# Patient Record
Sex: Female | Born: 1935 | Race: White | Hispanic: No | Smoking: Former smoker
Health system: Southern US, Community
[De-identification: ages and names within clinical notes are randomized; demographics above are authoritative.]

## PROBLEM LIST (undated history)

## (undated) DIAGNOSIS — F0390 Unspecified dementia without behavioral disturbance: Secondary | ICD-10-CM

## (undated) DIAGNOSIS — J189 Pneumonia, unspecified organism: Secondary | ICD-10-CM

## (undated) DIAGNOSIS — J449 Chronic obstructive pulmonary disease, unspecified: Secondary | ICD-10-CM

## (undated) DIAGNOSIS — I509 Heart failure, unspecified: Secondary | ICD-10-CM

## (undated) DIAGNOSIS — E039 Hypothyroidism, unspecified: Secondary | ICD-10-CM

## (undated) DIAGNOSIS — N189 Chronic kidney disease, unspecified: Secondary | ICD-10-CM

## (undated) DIAGNOSIS — C801 Malignant (primary) neoplasm, unspecified: Secondary | ICD-10-CM

## (undated) DIAGNOSIS — I4891 Unspecified atrial fibrillation: Secondary | ICD-10-CM

## (undated) HISTORY — PX: CATARACT EXTRACTION: SUR2

## (undated) HISTORY — PX: COLON SURGERY: SHX602

---

## 2015-12-20 DIAGNOSIS — J189 Pneumonia, unspecified organism: Secondary | ICD-10-CM

## 2015-12-20 HISTORY — DX: Pneumonia, unspecified organism: J18.9

## 2015-12-29 ENCOUNTER — Encounter (HOSPITAL_COMMUNITY): Payer: Self-pay | Admitting: Emergency Medicine

## 2015-12-29 ENCOUNTER — Emergency Department (HOSPITAL_COMMUNITY): Payer: Medicare Other

## 2015-12-29 ENCOUNTER — Inpatient Hospital Stay (HOSPITAL_COMMUNITY)
Admission: EM | Admit: 2015-12-29 | Discharge: 2016-01-20 | DRG: 871 | Disposition: E | Payer: Medicare Other | Attending: Internal Medicine | Admitting: Internal Medicine

## 2015-12-29 DIAGNOSIS — J189 Pneumonia, unspecified organism: Secondary | ICD-10-CM | POA: Diagnosis present

## 2015-12-29 DIAGNOSIS — Z8679 Personal history of other diseases of the circulatory system: Secondary | ICD-10-CM

## 2015-12-29 DIAGNOSIS — E039 Hypothyroidism, unspecified: Secondary | ICD-10-CM | POA: Diagnosis present

## 2015-12-29 DIAGNOSIS — I083 Combined rheumatic disorders of mitral, aortic and tricuspid valves: Secondary | ICD-10-CM | POA: Diagnosis present

## 2015-12-29 DIAGNOSIS — J9600 Acute respiratory failure, unspecified whether with hypoxia or hypercapnia: Secondary | ICD-10-CM

## 2015-12-29 DIAGNOSIS — F0390 Unspecified dementia without behavioral disturbance: Secondary | ICD-10-CM | POA: Diagnosis present

## 2015-12-29 DIAGNOSIS — Z885 Allergy status to narcotic agent status: Secondary | ICD-10-CM

## 2015-12-29 DIAGNOSIS — A419 Sepsis, unspecified organism: Principal | ICD-10-CM

## 2015-12-29 DIAGNOSIS — R680 Hypothermia, not associated with low environmental temperature: Secondary | ICD-10-CM | POA: Diagnosis present

## 2015-12-29 DIAGNOSIS — I482 Chronic atrial fibrillation, unspecified: Secondary | ICD-10-CM | POA: Diagnosis present

## 2015-12-29 DIAGNOSIS — J42 Unspecified chronic bronchitis: Secondary | ICD-10-CM | POA: Diagnosis not present

## 2015-12-29 DIAGNOSIS — R9431 Abnormal electrocardiogram [ECG] [EKG]: Secondary | ICD-10-CM | POA: Diagnosis present

## 2015-12-29 DIAGNOSIS — Y95 Nosocomial condition: Secondary | ICD-10-CM | POA: Diagnosis present

## 2015-12-29 DIAGNOSIS — E875 Hyperkalemia: Secondary | ICD-10-CM | POA: Diagnosis not present

## 2015-12-29 DIAGNOSIS — M25552 Pain in left hip: Secondary | ICD-10-CM | POA: Diagnosis present

## 2015-12-29 DIAGNOSIS — Z79899 Other long term (current) drug therapy: Secondary | ICD-10-CM

## 2015-12-29 DIAGNOSIS — R0602 Shortness of breath: Secondary | ICD-10-CM

## 2015-12-29 DIAGNOSIS — E872 Acidosis: Secondary | ICD-10-CM

## 2015-12-29 DIAGNOSIS — E871 Hypo-osmolality and hyponatremia: Secondary | ICD-10-CM | POA: Diagnosis present

## 2015-12-29 DIAGNOSIS — N179 Acute kidney failure, unspecified: Secondary | ICD-10-CM | POA: Diagnosis present

## 2015-12-29 DIAGNOSIS — E860 Dehydration: Secondary | ICD-10-CM | POA: Diagnosis present

## 2015-12-29 DIAGNOSIS — Z66 Do not resuscitate: Secondary | ICD-10-CM | POA: Diagnosis present

## 2015-12-29 DIAGNOSIS — R52 Pain, unspecified: Secondary | ICD-10-CM

## 2015-12-29 DIAGNOSIS — E43 Unspecified severe protein-calorie malnutrition: Secondary | ICD-10-CM | POA: Diagnosis present

## 2015-12-29 DIAGNOSIS — N182 Chronic kidney disease, stage 2 (mild): Secondary | ICD-10-CM | POA: Diagnosis present

## 2015-12-29 DIAGNOSIS — Z7189 Other specified counseling: Secondary | ICD-10-CM | POA: Insufficient documentation

## 2015-12-29 DIAGNOSIS — R740 Nonspecific elevation of levels of transaminase and lactic acid dehydrogenase [LDH]: Secondary | ICD-10-CM | POA: Diagnosis present

## 2015-12-29 DIAGNOSIS — J449 Chronic obstructive pulmonary disease, unspecified: Secondary | ICD-10-CM | POA: Diagnosis present

## 2015-12-29 DIAGNOSIS — J440 Chronic obstructive pulmonary disease with acute lower respiratory infection: Secondary | ICD-10-CM | POA: Diagnosis present

## 2015-12-29 DIAGNOSIS — Z6820 Body mass index (BMI) 20.0-20.9, adult: Secondary | ICD-10-CM

## 2015-12-29 DIAGNOSIS — R7989 Other specified abnormal findings of blood chemistry: Secondary | ICD-10-CM | POA: Diagnosis present

## 2015-12-29 DIAGNOSIS — Z825 Family history of asthma and other chronic lower respiratory diseases: Secondary | ICD-10-CM

## 2015-12-29 DIAGNOSIS — I5022 Chronic systolic (congestive) heart failure: Secondary | ICD-10-CM | POA: Diagnosis present

## 2015-12-29 DIAGNOSIS — Z87891 Personal history of nicotine dependence: Secondary | ICD-10-CM

## 2015-12-29 DIAGNOSIS — J441 Chronic obstructive pulmonary disease with (acute) exacerbation: Secondary | ICD-10-CM | POA: Diagnosis present

## 2015-12-29 DIAGNOSIS — J219 Acute bronchiolitis, unspecified: Secondary | ICD-10-CM

## 2015-12-29 DIAGNOSIS — J9601 Acute respiratory failure with hypoxia: Secondary | ICD-10-CM | POA: Diagnosis present

## 2015-12-29 DIAGNOSIS — Z888 Allergy status to other drugs, medicaments and biological substances status: Secondary | ICD-10-CM

## 2015-12-29 DIAGNOSIS — R54 Age-related physical debility: Secondary | ICD-10-CM | POA: Diagnosis present

## 2015-12-29 DIAGNOSIS — Z515 Encounter for palliative care: Secondary | ICD-10-CM | POA: Insufficient documentation

## 2015-12-29 HISTORY — DX: Unspecified atrial fibrillation: I48.91

## 2015-12-29 HISTORY — DX: Chronic kidney disease, unspecified: N18.9

## 2015-12-29 HISTORY — DX: Unspecified dementia, unspecified severity, without behavioral disturbance, psychotic disturbance, mood disturbance, and anxiety: F03.90

## 2015-12-29 HISTORY — DX: Hypothyroidism, unspecified: E03.9

## 2015-12-29 HISTORY — DX: Chronic obstructive pulmonary disease, unspecified: J44.9

## 2015-12-29 HISTORY — DX: Heart failure, unspecified: I50.9

## 2015-12-29 HISTORY — DX: Malignant (primary) neoplasm, unspecified: C80.1

## 2015-12-29 HISTORY — DX: Pneumonia, unspecified organism: J18.9

## 2015-12-29 LAB — BASIC METABOLIC PANEL
Anion gap: 11 (ref 5–15)
Anion gap: 9 (ref 5–15)
BUN: 49 mg/dL — ABNORMAL HIGH (ref 6–20)
BUN: 47 mg/dL — ABNORMAL HIGH (ref 6–20)
CO2: 21 mmol/L — ABNORMAL LOW (ref 22–32)
CO2: 22 mmol/L (ref 22–32)
Calcium: 8.9 mg/dL (ref 8.9–10.3)
Calcium: 8.4 mg/dL — ABNORMAL LOW (ref 8.9–10.3)
Chloride: 96 mmol/L — ABNORMAL LOW (ref 101–111)
Chloride: 99 mmol/L — ABNORMAL LOW (ref 101–111)
Creatinine, Ser: 1.18 mg/dL — ABNORMAL HIGH (ref 0.44–1.00)
Creatinine, Ser: 1.14 mg/dL — ABNORMAL HIGH (ref 0.44–1.00)
GFR calc Af Amer: 46 mL/min — ABNORMAL LOW (ref >60)
GFR calc Af Amer: 48 mL/min — ABNORMAL LOW (ref >60)
GFR calc non Af Amer: 39 mL/min — ABNORMAL LOW (ref >60)
GFR calc non Af Amer: 41 mL/min — ABNORMAL LOW (ref >60)
Glucose, Bld: 89 mg/dL (ref 65–99)
Glucose, Bld: 103 mg/dL — ABNORMAL HIGH (ref 65–99)
Potassium: 5.3 mmol/L — ABNORMAL HIGH (ref 3.5–5.1)
Potassium: 5.1 mmol/L (ref 3.5–5.1)
Sodium: 128 mmol/L — ABNORMAL LOW (ref 135–145)
Sodium: 130 mmol/L — ABNORMAL LOW (ref 135–145)

## 2015-12-29 LAB — HEPATIC FUNCTION PANEL
ALT: 128 U/L — ABNORMAL HIGH (ref 14–54)
AST: 47 U/L — ABNORMAL HIGH (ref 15–41)
Albumin: 3.5 g/dL (ref 3.5–5.0)
Alkaline Phosphatase: 75 U/L (ref 38–126)
Bilirubin, Direct: 0.3 mg/dL (ref 0.1–0.5)
Indirect Bilirubin: 0.6 mg/dL (ref 0.3–0.9)
Total Bilirubin: 0.9 mg/dL (ref 0.3–1.2)
Total Protein: 6.6 g/dL (ref 6.5–8.1)

## 2015-12-29 LAB — DIFFERENTIAL
Basophils Absolute: 0 10*3/uL (ref 0.0–0.1)
Basophils Relative: 0 %
Eosinophils Absolute: 0 10*3/uL (ref 0.0–0.7)
Eosinophils Relative: 0 %
Lymphocytes Relative: 7 %
Lymphs Abs: 0.5 10*3/uL — ABNORMAL LOW (ref 0.7–4.0)
Monocytes Absolute: 0.4 10*3/uL (ref 0.1–1.0)
Monocytes Relative: 6 %
Neutro Abs: 6.5 10*3/uL (ref 1.7–7.7)
Neutrophils Relative %: 87 %

## 2015-12-29 LAB — CBC
HCT: 38.6 % (ref 36.0–46.0)
Hemoglobin: 12.3 g/dL (ref 12.0–15.0)
MCH: 28.8 pg (ref 26.0–34.0)
MCHC: 31.9 g/dL (ref 30.0–36.0)
MCV: 90.4 fL (ref 78.0–100.0)
Platelets: 155 10*3/uL (ref 150–400)
RBC: 4.27 MIL/uL (ref 3.87–5.11)
RDW: 18.9 % — ABNORMAL HIGH (ref 11.5–15.5)
WBC: 7.4 10*3/uL (ref 4.0–10.5)

## 2015-12-29 LAB — INFLUENZA PANEL BY PCR (TYPE A & B)
H1N1 flu by pcr: NOT DETECTED
Influenza A By PCR: NEGATIVE
Influenza B By PCR: NEGATIVE

## 2015-12-29 LAB — URINE MICROSCOPIC-ADD ON: RBC / HPF: NONE SEEN RBC/hpf (ref 0–5)

## 2015-12-29 LAB — URINALYSIS, ROUTINE W REFLEX MICROSCOPIC
Bilirubin Urine: NEGATIVE
Glucose, UA: NEGATIVE mg/dL
Hgb urine dipstick: NEGATIVE
Ketones, ur: NEGATIVE mg/dL
Leukocytes, UA: NEGATIVE
Nitrite: NEGATIVE
Protein, ur: 100 mg/dL — AB
Specific Gravity, Urine: 1.025 (ref 1.005–1.030)
pH: 5.5 (ref 5.0–8.0)

## 2015-12-29 LAB — PROCALCITONIN: Procalcitonin: <0.1 ng/mL

## 2015-12-29 LAB — I-STAT CG4 LACTIC ACID, ED
Lactic Acid, Venous: 2.02 mmol/L (ref 0.5–2.0)
Lactic Acid, Venous: 1.07 mmol/L (ref 0.5–2.0)

## 2015-12-29 LAB — STREP PNEUMONIAE URINARY ANTIGEN: Strep Pneumo Urinary Antigen: NEGATIVE

## 2015-12-29 LAB — I-STAT TROPONIN, ED: Troponin i, poc: 0.05 ng/mL (ref 0.00–0.08)

## 2015-12-29 MED ORDER — VANCOMYCIN HCL IN DEXTROSE 1-5 GM/200ML-% IV SOLN
1000.0000 mg | Freq: Once | INTRAVENOUS | Status: AC
  Administered 2015-12-29: 1000 mg via INTRAVENOUS
  Filled 2015-12-29: qty 200

## 2015-12-29 MED ORDER — ALBUTEROL SULFATE (2.5 MG/3ML) 0.083% IN NEBU
5.0000 mg | INHALATION_SOLUTION | Freq: Once | RESPIRATORY_TRACT | Status: AC
  Administered 2015-12-29: 5 mg via RESPIRATORY_TRACT
  Filled 2015-12-29: qty 6

## 2015-12-29 MED ORDER — SODIUM CHLORIDE 0.9 % IV BOLUS (SEPSIS)
1000.0000 mL | INTRAVENOUS | Status: AC
  Administered 2015-12-29 (×2): 1000 mL via INTRAVENOUS

## 2015-12-29 MED ORDER — PIPERACILLIN-TAZOBACTAM 3.375 G IVPB 30 MIN
3.3750 g | Freq: Once | INTRAVENOUS | Status: AC
  Administered 2015-12-29: 3.375 g via INTRAVENOUS
  Filled 2015-12-29: qty 50

## 2015-12-29 MED ORDER — LEVOFLOXACIN IN D5W 750 MG/150ML IV SOLN: 750.0000 mg | INTRAVENOUS | Status: DC

## 2015-12-29 MED ORDER — SODIUM CHLORIDE 0.9 % IV SOLN
INTRAVENOUS | Status: DC
  Administered 2015-12-29 – 2015-12-31 (×4): via INTRAVENOUS

## 2015-12-29 MED ORDER — LEVOFLOXACIN IN D5W 750 MG/150ML IV SOLN
750.0000 mg | Freq: Once | INTRAVENOUS | Status: AC
  Administered 2015-12-29: 750 mg via INTRAVENOUS
  Filled 2015-12-29: qty 150

## 2015-12-29 MED ORDER — LEVOFLOXACIN IN D5W 500 MG/100ML IV SOLN: 500.0000 mg | INTRAVENOUS | Status: DC

## 2015-12-29 MED ORDER — LEVALBUTEROL HCL 0.63 MG/3ML IN NEBU
0.6300 mg | INHALATION_SOLUTION | Freq: Four times a day (QID) | RESPIRATORY_TRACT | Status: DC
  Administered 2015-12-29 (×2): 0.63 mg via RESPIRATORY_TRACT
  Filled 2015-12-29 (×3): qty 3

## 2015-12-29 MED ORDER — SODIUM CHLORIDE 0.9 % IV BOLUS (SEPSIS)
500.0000 mL | Freq: Once | INTRAVENOUS | Status: AC
  Administered 2015-12-29: 500 mL via INTRAVENOUS

## 2015-12-29 MED ORDER — ALPRAZOLAM 0.25 MG PO TABS
0.2500 mg | ORAL_TABLET | Freq: Every evening | ORAL | Status: DC | PRN
  Administered 2015-12-30: 0.25 mg via ORAL
  Filled 2015-12-29: qty 1

## 2015-12-29 MED ORDER — PIPERACILLIN-TAZOBACTAM 3.375 G IVPB: 3.3750 g | Freq: Three times a day (TID) | INTRAVENOUS | Status: DC

## 2015-12-29 MED ORDER — ENOXAPARIN SODIUM 30 MG/0.3ML ~~LOC~~ SOLN
30.0000 mg | SUBCUTANEOUS | Status: DC
  Filled 2015-12-29: qty 0.3

## 2015-12-29 MED ORDER — ENOXAPARIN SODIUM 30 MG/0.3ML ~~LOC~~ SOLN
30.0000 mg | Freq: Every day | SUBCUTANEOUS | Status: DC
  Administered 2015-12-31: 30 mg via SUBCUTANEOUS
  Filled 2015-12-29 (×2): qty 0.3

## 2015-12-29 MED ORDER — CYCLOSPORINE 0.05 % OP EMUL
1.0000 [drp] | Freq: Two times a day (BID) | OPHTHALMIC | Status: DC
  Administered 2015-12-29 – 2015-12-31 (×3): 1 [drp] via OPHTHALMIC
  Filled 2015-12-29 (×6): qty 1

## 2015-12-29 MED ORDER — LORAZEPAM 2 MG/ML IJ SOLN
0.5000 mg | Freq: Once | INTRAMUSCULAR | Status: AC
  Administered 2015-12-29: 0.5 mg via INTRAVENOUS
  Filled 2015-12-29: qty 1

## 2015-12-29 MED ORDER — VANCOMYCIN HCL 500 MG IV SOLR: 500.0000 mg | INTRAVENOUS | Status: DC

## 2015-12-29 MED ORDER — LEVOTHYROXINE SODIUM 25 MCG PO TABS: 25.0000 ug | ORAL_TABLET | Freq: Every day | ORAL | Status: DC

## 2015-12-29 MED ORDER — LORAZEPAM 2 MG/ML IJ SOLN
0.5000 mg | Freq: Once | INTRAMUSCULAR | Status: DC
  Filled 2015-12-29: qty 1

## 2015-12-29 NOTE — ED Notes (Addendum)
Pt sent from PCP for low HR and low 02 sat. Pt reports feeling sob and weak. Pt is alert and ox4. Pt appears lethargic and falling asleep during triage. Per daughter- pt has becoming more lethargic, decreased appetite.

## 2015-12-29 NOTE — Progress Notes (Signed)
Pharmacy Code Sepsis Protocol  Time of code sepsis page: 1310 Time of antibiotic delivery: 1314  Were antibiotics ordered at the time of the code sepsis page? Yes Was it required to contact the physician? [x]  Physician not contacted []  Physician contacted to order antibiotics for code sepsis []  Physician contacted to recommend changing antibiotics  Pharmacy consulted for: Zosyn and vancomycin  Anti-infectives    Start     Dose/Rate Route Frequency Ordered Stop   12/30/15 1400  vancomycin (VANCOCIN) 500 mg in sodium chloride 0.9 % 100 mL IVPB     500 mg 100 mL/hr over 60 Minutes Intravenous Every 24 hours 12/31/2015 1309     01/02/2016 2000  piperacillin-tazobactam (ZOSYN) IVPB 3.375 g     3.375 g 12.5 mL/hr over 240 Minutes Intravenous Every 8 hours 01/09/2016 1309     01/05/2016 1315  piperacillin-tazobactam (ZOSYN) IVPB 3.375 g     3.375 g 100 mL/hr over 30 Minutes Intravenous  Once 12/31/2015 1303     12/31/2015 1315  vancomycin (VANCOCIN) IVPB 1000 mg/200 mL premix     1,000 mg 200 mL/hr over 60 Minutes Intravenous  Once 01/15/2016 1303        @infusions @   Nurse education provided: [x]  Minutes left to administer antibiotics to achieve 1 hour goal [x]  Correct order of antibiotic administration [x]  Antibiotic Y-site compatibilities    Elenor Quinones, PharmD, BCPS Clinical Pharmacist Pager [phone] 01/14/2016 1:15 PM

## 2015-12-29 NOTE — ED Notes (Signed)
Pt O2 sats at 83% on 2L, increased to 4L and only improved to 85%, increased to 6L and improved to 86%. MD Belfi made aware and advised to place pt on NRB. Pt O2 sats at 91%.

## 2015-12-29 NOTE — ED Provider Notes (Signed)
CSN: [id number]     Arrival date & time 01/12/2016  1210 History   First MD Initiated Contact with Patient 01/18/2016 1230     Chief Complaint  Patient presents with  . Shortness of Breath     (Consider location/radiation/quality/duration/timing/severity/associated sxs/prior Treatment) HPI Comments: Patient with a history of COPD and CHF presents with a three-day history of worsening cough. She was seen in urgent care 2 days ago. She was noted that time to have a clear chest x-ray. She was however placed on a Z-Pak. She was given a breathing treatment there. Since that time she's continued to decline. Her daughter states that she's not eating or drinking. She's been very fatigued and has low energy. She's had some nausea but no vomiting. She was seen by her PCP today who sent her to the emergency department. She was noted to have hypoxia at the PCPs office.  Patient is a 82 y.o. female presenting with shortness of breath.  Shortness of Breath Associated symptoms: cough   Associated symptoms: no abdominal pain, no chest pain, no diaphoresis, no fever, no headaches, no rash and no vomiting     Past Medical History  Diagnosis Date  . CHF (congestive heart failure) ([place])   . COPD (chronic obstructive pulmonary disease) [facility])    Past Surgical History  Procedure Laterality Date  . Colon surgery     No family history on file. Social History  Substance Use Topics  . Smoking status: Never Smoker   . Smokeless tobacco: None  . Alcohol Use: No   OB History    No data available     Review of Systems  Constitutional: Positive for activity change, appetite change and fatigue. Negative for fever, chills and diaphoresis.  HENT: Negative for congestion, rhinorrhea and sneezing.   Eyes: Negative.   Respiratory: Positive for cough and shortness of breath. Negative for chest tightness.   Cardiovascular: Positive for leg swelling. Negative for chest pain.  Gastrointestinal: Positive for nausea.  Negative for vomiting, abdominal pain, diarrhea and blood in stool.  Genitourinary: Negative for frequency, hematuria, flank pain and difficulty urinating.  Musculoskeletal: Negative for back pain and arthralgias.  Skin: Negative for rash.  Neurological: Negative for dizziness, speech difficulty, weakness, numbness and headaches.      Allergies  Codeine and Paxil  Home Medications   Prior to Admission medications   Medication Sig Start Date End Date Taking? Authorizing Provider  albuterol (PROAIR HFA) 108 (90 Base) MCG/ACT inhaler Inhale 2 puffs into the lungs every 6 (six) hours as needed for wheezing or shortness of breath.  08/12/15  Yes Historical Provider, MD  ALPRAZolam Duanne Moron) 0.5 MG tablet Take 0.25 mg by mouth at bedtime as needed for anxiety or sleep.    Yes Historical Provider, MD  benzonatate (TESSALON) 100 MG capsule Take 100 mg by mouth 2 (two) times daily as needed for cough.  12/27/15  Yes Historical Provider, MD  Calcium-Vitamin D-Vitamin K (VIACTIV) [id number] MG-UNT-MCG CHEW Chew 1 tablet by mouth daily.   Yes Historical Provider, MD  carvedilol (COREG) 6.25 MG tablet Take 6.25 mg by mouth 2 (two) times daily. 10/22/15  Yes Historical Provider, MD  cycloSPORINE (RESTASIS) 0.05 % ophthalmic emulsion Place 1 drop into both eyes 2 (two) times daily. 03/28/14  Yes Historical Provider, MD  ferrous sulfate 325 (65 FE) MG tablet Take 325 mg by mouth daily.   Yes Historical Provider, MD  furosemide (LASIX) 20 MG tablet Take 20 mg by mouth every other  day. 11/06/15  Yes Historical Provider, MD  furosemide (LASIX) 40 MG tablet Take 40 mg by mouth every other day. 11/06/15  Yes Historical Provider, MD  guaiFENesin (MUCINEX) 600 MG 12 hr tablet Take 600 mg by mouth 2 (two) times daily as needed for cough or to loosen phlegm.  12/25/09  Yes Historical Provider, MD  lisinopril (PRINIVIL,ZESTRIL) 2.5 MG tablet Take 2.5 mg by mouth daily. 10/18/15  Yes Historical Provider, MD  Multiple Vitamin  (MULTIVITAMIN WITH MINERALS) TABS tablet Take 1 tablet by mouth daily.   Yes Historical Provider, MD  SYNTHROID 25 MCG tablet Take 25 mcg by mouth daily. 11/05/15  Yes Historical Provider, MD   BP 109/86 mmHg  Pulse 97  Temp(Src) 94.5 F (34.7 C) (Oral)  Resp 16  Ht 5\' 4"  (1.626 m)  Wt 120 lb (54.432 kg)  BMI 20.59 kg/m2  SpO2 100% Physical Exam  Constitutional: She is oriented to person, place, and time. She appears well-developed and well-nourished.  HENT:  Head: Normocephalic and atraumatic.  Eyes: Pupils are equal, round, and reactive to light.  Neck: Normal range of motion. Neck supple.  Cardiovascular: Normal rate, regular rhythm and normal heart sounds.   Pulmonary/Chest: Effort normal. No respiratory distress. She has wheezes. She has rales. She exhibits no tenderness.  Rhonchi bilaterally  Abdominal: Soft. Bowel sounds are normal. There is no tenderness. There is no rebound and no guarding.  Musculoskeletal: Normal range of motion. She exhibits edema.  1+ pitting edema to the left lower extremity. The right lower extremity is wrapped in a Duoderm dressing  Lymphadenopathy:    She has no cervical adenopathy.  Neurological: She is alert and oriented to person, place, and time.  Skin: Skin is warm and dry. No rash noted.  Psychiatric: She has a normal mood and affect.    ED Course  Procedures (including critical care time) Labs Review Labs Reviewed  BASIC METABOLIC PANEL - Abnormal; Notable for the following:    Sodium 128 (*)    Potassium 5.3 (*)    Chloride 96 (*)    CO2 21 (*)    BUN 49 (*)    Creatinine, Ser 1.18 (*)    GFR calc non Af Amer 39 (*)    GFR calc Af Amer 46 (*)    All other components within normal limits  CBC - Abnormal; Notable for the following:    RDW 18.9 (*)    All other components within normal limits  DIFFERENTIAL - Abnormal; Notable for the following:    Lymphs Abs 0.5 (*)    All other components within normal limits  I-STAT CG4  LACTIC ACID, ED - Abnormal; Notable for the following:    Lactic Acid, Venous 2.02 (*)    All other components within normal limits  CULTURE, BLOOD (ROUTINE X 2)  CULTURE, BLOOD (ROUTINE X 2)  URINE CULTURE  RESPIRATORY VIRUS PANEL  URINALYSIS, ROUTINE W REFLEX MICROSCOPIC (NOT AT [facility])  CBC WITH DIFFERENTIAL/PLATELET  BASIC METABOLIC PANEL  PROCALCITONIN  HEPATIC FUNCTION PANEL  INFLUENZA PANEL BY PCR (TYPE A & B, H1N1)  I-STAT TROPOININ, ED  I-STAT CG4 LACTIC ACID, ED    Imaging Review Dg Chest Port 1 View  01/11/2016  CLINICAL DATA:  Sepsis. EXAM: PORTABLE CHEST 1 VIEW COMPARISON:  None. FINDINGS: There is hyperinflation and interstitial coarsening correlating with patient's history of COPD. Cardiomegaly and aortic tortuosity without suspected edema. No effusion or pneumothorax. No focal consolidation. IMPRESSION: 1. COPD findings without definite superimposed  pneumonia. 2. Cardiomegaly. Electronically Signed   By: Monte Fantasia M.D.   On: 01/07/2016 13:55   I have personally reviewed and evaluated these images and lab results as part of my medical decision-making.   EKG Interpretation None      MDM   Final diagnoses:  Sepsis, due to unspecified organism [facility])    Patient presents and sepsis. She is hypothermic. She was placed on a bear hugger. She was started on sepsis protocol with IV fluids that were given in 500 cc increments given her history of CHF. She was given broad-spectrum antibiotics. Her symptoms are consistent with pneumonia but her chest x-ray does not show evidence of pneumonia or CHF. However she continues to be hypoxic with congested sounding lungs. She was initially placed on a nasal cannula which was increased to nonrebreather. She was maintaining sats in the upper 90s on the nonrebreather. However she started having increased work of breathing so BiPAP was initiated. I consulted with [facility] to a minute the patient to stepdown.  CRITICAL  CARE Performed by: Musa Rewerts Total critical care time: 40 minutes Critical care time was exclusive of separately billable procedures and treating other patients. Critical care was necessary to treat or prevent imminent or life-threatening deterioration. Critical care was time spent personally by me on the following activities: development of treatment plan with patient and/or surrogate as well as nursing, discussions with consultants, evaluation of patient's response to treatment, examination of patient, obtaining history from patient or surrogate, ordering and performing treatments and interventions, ordering and review of laboratory studies, ordering and review of radiographic studies, pulse oximetry and re-evaluation of patient's condition.     Malvin Johns, MD 01/11/2016 [phone]

## 2015-12-29 NOTE — Progress Notes (Signed)
Pt placed on bipap per MD request. Resting comfortably, vital signs stable. RT will continue to monitor.

## 2015-12-29 NOTE — ED Notes (Signed)
Hospital Bed ordered @ 2341.

## 2015-12-29 NOTE — H&P (Signed)
[facility] History and Physical                                                                                    Marilyn Henderson, is a 82 y.o. female  MRN: [id number]   DOB - [birth year]  Admit Date - 12/20/2015  Outpatient Primary MD for the patient is Shamleffer, Herschell Dimes, MD  Referring MD: Tamera Punt / ER  PMH: Past Medical History  Diagnosis Date  . CHF (congestive heart failure) ([place])   . COPD (chronic obstructive pulmonary disease) ([facility])       PSH: Past Surgical History  Procedure Laterality Date  . Colon surgery       CC:  Chief Complaint  Patient presents with  . Shortness of Breath     HPI: This is a 82 year old female patient recently moved to the [place] area about 2 months ago from [place] who has a past medical history of COPD and recurrent chronic bronchitis/bronchiectasis, remote hemoptysis, CHF not specified, chronic atrial fibrillation not on anticoagulation, hypothyroidism, dementia and stage II chronic kidney disease. History obtained from patient's daughter at bedside due to patient's underlying acute respiratory failure and need for BiPAP. Patient began having respiratory symptoms on Friday with cough of clear productive sputum. She continued have constant coughing. Her daughter came to [facility] to continue to monitor her mother. She reports at that time nursing checked the patient and her oxygen levels, BP and temperature were okay. Sunday she was taken to a local urgent care where an x-ray was done that showed no evidence of pneumonia. She was given a nebulizer treatment and empiric Z-Pak. Unfortunately by Monday she worsened and began having increasing yellow productive sputum and increasing malaise and poor appetite. She also developed increasing anxiety. During this entire time she had not run any fevers. Because of concerns over dehydration her daughter held her Lasix today but had given her usual heart medications.  Because of nausea her iron was not given. Because she continued to do poorly and was hypoxemic today the daughter requested the patient be evaluated by primary physician Dr. Felipa Eth. Patient appeared very dehydrated and they were unable to obtain a pulse oximetry reading so patient was sent to the ER for further evaluation. His daughter reports that baseline blood pressure readings for this patient are anywhere between 90/60 and 100/70. She's not had any emesis or diarrhea although she has been nauseated. No apparent urinary symptoms although since onset of excessive coughing patient has had significant nocturia.  ER Evaluation and treatment: Oral temperature 94.5, initial BP 85/65 with a heart rate of 104, room air saturations 84%. After fluid challenges blood pressure has come up to 109/86 with a pulse of 97 respirations 16. Oxygen has been applied via BiPAP and saturations are 100% and patient has been noticed to have decreased work of breathing Portable chest x-ray: COPD without pneumonia, cardiomegaly EKG: Atrial fibrillation with ventricular rate 80 bpm, QTC 4 56 ms, subtle ST segment flattening versus depression in leads V5 and V6 but otherwise no changes Troponin 0.05 Sodium 128, potassium 5.3 BUN 49 and creatinine 1.18 AST 47 and ALT 128 with normal  total bilirubin Initial lactic acid 2.2 with decreased at 1.07 after fluid challenges WBC normal except for elevated neutrophils 87% Blood cultures are been obtained Urinalysis and culture pending collection Normal saline bolus IV 2 L Vancomycin and Zosyn 1 dose each Proventil nebulizer 1 BiPAP   Review of Systems   In addition to the HPI above,  No Fever No Headache, changes with Vision or hearing, new focal weakness, tingling, numbness in any extremity, No problems swallowing food or Liquids, indigestion/reflux No Chest pain No Abdominal pain, emesis, melena or hematochezia, no dark tarry stools No dysuria, hematuria or flank  pain No new skin rashes, lesions, masses or bruises, No new joints pains-aches No recent weight gain or loss No polyuria, polydypsia or polyphagia,  *A full 10 point Review of Systems was done, except as stated above, all other Review of Systems were negative.  Social History Social History  Substance Use Topics  . Smoking status: Never Smoker   . Smokeless tobacco: Not on file  . Alcohol Use: No    Resides at: [facility]  Lives with: Alone  Ambulatory status: Cane-daughter reports that after severe bronchitis episode in the past 12 months patient never really fully recovered and began to walk with an assistive device   Family History No family history on file. discussed with patient's daughters and no significant family history that is contributory to current admission   Prior to Admission medications   Medication Sig Start Date End Date Taking? Authorizing Provider  albuterol (PROAIR HFA) 108 (90 Base) MCG/ACT inhaler Inhale 2 puffs into the lungs every 6 (six) hours as needed for wheezing or shortness of breath.  08/12/15  Yes Historical Provider, MD  ALPRAZolam Duanne Moron) 0.5 MG tablet Take 0.25 mg by mouth at bedtime as needed for anxiety or sleep.    Yes Historical Provider, MD  benzonatate (TESSALON) 100 MG capsule Take 100 mg by mouth 2 (two) times daily as needed for cough.  12/27/15  Yes Historical Provider, MD  Calcium-Vitamin D-Vitamin K (VIACTIV) [id number] MG-UNT-MCG CHEW Chew 1 tablet by mouth daily.   Yes Historical Provider, MD  carvedilol (COREG) 6.25 MG tablet Take 6.25 mg by mouth 2 (two) times daily. 10/22/15  Yes Historical Provider, MD  cycloSPORINE (RESTASIS) 0.05 % ophthalmic emulsion Place 1 drop into both eyes 2 (two) times daily. 03/28/14  Yes Historical Provider, MD  ferrous sulfate 325 (65 FE) MG tablet Take 325 mg by mouth daily.   Yes Historical Provider, MD  furosemide (LASIX) 20 MG tablet Take 20 mg by mouth every other day.  11/06/15  Yes Historical Provider, MD  furosemide (LASIX) 40 MG tablet Take 40 mg by mouth every other day. 11/06/15  Yes Historical Provider, MD  guaiFENesin (MUCINEX) 600 MG 12 hr tablet Take 600 mg by mouth 2 (two) times daily as needed for cough or to loosen phlegm.  12/25/09  Yes Historical Provider, MD  lisinopril (PRINIVIL,ZESTRIL) 2.5 MG tablet Take 2.5 mg by mouth daily. 10/18/15  Yes Historical Provider, MD  Multiple Vitamin (MULTIVITAMIN WITH MINERALS) TABS tablet Take 1 tablet by mouth daily.   Yes Historical Provider, MD  SYNTHROID 25 MCG tablet Take 25 mcg by mouth daily. 11/05/15  Yes Historical Provider, MD    Allergies  Allergen Reactions  . Codeine   . Paxil [Paroxetine Hcl]     Altered mental status    Physical Exam  Vitals  Blood pressure 109/86, pulse 97, temperature 94.5 F (34.7 C), temperature source  Oral, resp. rate 16, height 5\' 4"  (1.626 m), weight 120 lb (54.432 kg), SpO2 100 %.   General:  In moderate acute respiratory distress that has improved with application of BiPAP, patient appears frail and malnourished  Psych: Anxious and confused affect, oriented to name only; able to follow simple commands easily  Neuro:   No focal neurological deficits, CN II through XII intact, Strength 4/5 all 4 extremities, Sensation intact all 4 extremities.  ENT:  Ears and Eyes appear Normal, Conjunctivae clear, PER. Dry oral mucosa without erythema or exudates.  Neck:  Supple, No lymphadenopathy appreciated  Respiratory:  Symmetrical chest wall movement, diminished air movement bilaterally though improved after application of BiPAP, coarse to auscultation bilaterally   Cardiac: Irregular with underlying atrial fibrillation, No Murmurs, no LE edema noted, no JVD, No carotid bruits, peripheral pulses palpable at 2+  Abdomen:  Positive bowel sounds, Soft, Non tender, Non distended,  No masses appreciated, no obvious hepatosplenomegaly  Skin:  No Cyanosis, poor Skin  Turgor, No Skin Rash   Extremities: Symmetrical without obvious trauma or injury,  no effusions.  Data Review  CBC  Recent Labs Lab 12/21/2015 1222  WBC 7.4  HGB 12.3  HCT 38.6  PLT 155  MCV 90.4  MCH 28.8  MCHC 31.9  RDW 18.9*  LYMPHSABS 0.5*  MONOABS 0.4  EOSABS 0.0  BASOSABS 0.0    Chemistries   Recent Labs Lab 12/20/2015 1222 01/14/2016 1607  NA 128*  --   K 5.3*  --   CL 96*  --   CO2 21*  --   GLUCOSE 89  --   BUN 49*  --   CREATININE 1.18*  --   CALCIUM 8.9  --   AST  --  47*  ALT  --  128*  ALKPHOS  --  75  BILITOT  --  0.9    estimated creatinine clearance is 27.2 mL/min (by C-G formula based on Cr of 1.18).  No results for input(s): TSH, T4TOTAL, T3FREE, THYROIDAB in the last 72 hours.  Invalid input(s): FREET3  Coagulation profile No results for input(s): INR, PROTIME in the last 168 hours.  No results for input(s): DDIMER in the last 72 hours.  Cardiac Enzymes No results for input(s): CKMB, TROPONINI, MYOGLOBIN in the last 168 hours.  Invalid input(s): CK  Invalid input(s): POCBNP  Urinalysis No results found for: COLORURINE, APPEARANCEUR, LABSPEC, PHURINE, GLUCOSEU, HGBUR, BILIRUBINUR, KETONESUR, PROTEINUR, UROBILINOGEN, NITRITE, LEUKOCYTESUR  Imaging results:   Dg Chest Port 1 View    CLINICAL DATA:  Sepsis. EXAM: PORTABLE CHEST 1 VIEW COMPARISON:  None. FINDINGS: There is hyperinflation and interstitial coarsening correlating with patient's history of COPD. Cardiomegaly and aortic tortuosity without suspected edema. No effusion or pneumothorax. No focal consolidation. IMPRESSION: 1. COPD findings without definite superimposed pneumonia. 2. Cardiomegaly. Electronically Signed   By: Monte Fantasia M.D.   On: 01/19/2016 13:55     EKG: (Independently reviewed)  Atrial fibrillation with ventricular rate 80 bpm, QTC 4 56 ms, subtle ST segment flattening versus depression in leads V5 and V6 but otherwise no changes   Assessment &  Plan  Principal Problem:   Acute respiratory failure with hypoxia /Acute bronchiolitis/ COPD  -Stepdown/observation -No focal pneumonia on chest x-ray but given dehydration may not "fluff out" until after euvolemic so we'll repeat chest x-ray in a.m. -Initiate pneumonia order set; follow up on blood cultures, sputum culture, urinary strep and Legionella -Empiric IV Levaquin -Continue BiPAP and wean as tolerated -  History of atrial fibrillation and recent tachycardia so we'll utilize Xopenex for nebs -Not wheezing and given advanced age would like to avoid use of steroids if possible -IV fluid hydration  Active Problems:   Acute hyperkalemia -Secondary to acute dehydration and acute renal failure -Holding preadmission Lasix and ACE inhibitor -Follow-up electrolyte panel post volume resuscitation in ER -K+ has decreased to 5.1 so we will continue volume resuscitation    Elevated lactic acid level -Secondary to acute renal failure and volume depletion -Lactic acid has improved after hydration and patient has low normal blood pressure readings at baseline and upon review of vital signs and other laboratory data does not meet criteria for sepsis    Dehydration with hyponatremia -Sodium has increased with volume resuscitation in the ER -Continue IV fluids at 100 mL per hour -Repeat labs in a.m.    Acute renal failure /CKD, stage II -Baseline creatinine unknown but upon review of records from care everywhere documented with stage II chronic kidney disease -Renal function slightly improved after following repletion -Follow labs -Avoid nephrotoxic medications    Chronic atrial fibrillation  -Family confirms this is chronic diagnoses -Previously followed by cardiologist in [place] -Currently rate controlled -Carvedilol on hold until euvolemic -CHADVASc = 5 and was not on anticoagulation prior to admission    Hypothyroidism -Continue Synthroid -Check TSH in a.m.     Dementia -Progressive issue for several years -Given above issues with acute respiratory failure once stable from a respiratory standpoint may need to undergo formal swallowing evaluation to rule out aspiration as potential contributing etiology    History of CHF  -Subtype unknown and family does not recall patient's actual ejection fraction -Family reports last echo greater than 1 year ago so we'll obtain echocardiogram this admission -Watch closely with volume resuscitation    Abnormal EKG -Does have subtle ST segment changes in lateral leads -No prior EKG for comparison but under care everywhere a previous read-out of an EKG from 2011 reads as completely normal -Repeat EKG in a.m. -Initial troponin within normal limits    DVT Prophylaxis: Lovenox  Family Communication:   Daughters at bedside  Code Status:  DO NOT RESUSCITATE  Condition:  Guarded  Discharge disposition: Anticipate discharge back to [facility] once medically stable; given severity of acute illness likely will need PT and OT evaluation to determine if appropriate for that level of care  Time spent in minutes : 60      ELLIS,ALLISON L. ANP on 12/25/2015 at 4:33 PM  You may contact me by going to [email or web address] - password [email or web address]  I am available from 7a-7p but please confirm I am on the schedule by going to Amion as above.   After 7p please contact night coverage person covering me after hours  [facility]

## 2015-12-29 NOTE — Progress Notes (Signed)
ANTIBIOTIC CONSULT NOTE - INITIAL  Pharmacy Consult for Zosyn and Vancomycin Indication: sepsis  Allergies  Allergen Reactions  . Codeine     Patient Measurements: Height: 5\' 4"  (162.6 cm) Weight: 120 lb (54.432 kg) IBW/kg (Calculated) : 54.7  Vital Signs: BP: 96/76 mmHg (01/10 1216) Pulse Rate: 82 (01/10 1216) Intake/Output from previous day:   Intake/Output from this shift:    Labs:  Recent Labs  12/30/2015 1222  WBC 7.4  HGB 12.3  PLT 155  CREATININE 1.18*   Estimated Creatinine Clearance: 27.2 mL/min (by C-G formula based on Cr of 1.18). No results for input(s): VANCOTROUGH, VANCOPEAK, VANCORANDOM, GENTTROUGH, GENTPEAK, GENTRANDOM, TOBRATROUGH, TOBRAPEAK, TOBRARND, AMIKACINPEAK, AMIKACINTROU, AMIKACIN in the last 72 hours.   Microbiology: No results found for this or any previous visit (from the past 720 hour(s)).  Medical History: Past Medical History  Diagnosis Date  . CHF (congestive heart failure) ([place])   . COPD (chronic obstructive pulmonary disease) (HCC)    Assessment: 82 yo F presents on 1/10 with low HR and low O2 sat. Pt reports feeling SOB and weak. Pharmacy consulted to dose abx for sepsis. Vanc 1g and Zosyn x 1 ordered in the ED. No temp in EPIC, WBC wnl. SCr 1.18, CrCl ~84ml/min.  Goal of Therapy:  Vancomycin trough level 15-20 mcg/ml  Resolution of infection  Plan:  Continue Zosyn 3.375 gm IV q8h (4 hour infusion) Start vancomycin 500mg  IV Q24 tomorrow Monitor clinical picture, renal function, VT prn F/U C&S, abx deescalation / LOT  Elenor Quinones, PharmD, BCPS Clinical Pharmacist Pager [phone] 01/19/2016 1:06 PM

## 2015-12-29 NOTE — Progress Notes (Signed)
Pt taken off BIPAP for comfort. Placed on [place] at 3lt . Sats 98% no distress noted

## 2015-12-29 NOTE — ED Notes (Signed)
Pt placed on bear hugger.  

## 2015-12-30 ENCOUNTER — Observation Stay (HOSPITAL_COMMUNITY): Payer: Medicare Other

## 2015-12-30 ENCOUNTER — Observation Stay (HOSPITAL_BASED_OUTPATIENT_CLINIC_OR_DEPARTMENT_OTHER): Payer: Medicare Other

## 2015-12-30 DIAGNOSIS — N179 Acute kidney failure, unspecified: Secondary | ICD-10-CM | POA: Diagnosis not present

## 2015-12-30 DIAGNOSIS — E860 Dehydration: Secondary | ICD-10-CM | POA: Diagnosis present

## 2015-12-30 DIAGNOSIS — R680 Hypothermia, not associated with low environmental temperature: Secondary | ICD-10-CM | POA: Diagnosis present

## 2015-12-30 DIAGNOSIS — Z825 Family history of asthma and other chronic lower respiratory diseases: Secondary | ICD-10-CM | POA: Diagnosis not present

## 2015-12-30 DIAGNOSIS — Z888 Allergy status to other drugs, medicaments and biological substances status: Secondary | ICD-10-CM | POA: Diagnosis not present

## 2015-12-30 DIAGNOSIS — A419 Sepsis, unspecified organism: Secondary | ICD-10-CM | POA: Diagnosis not present

## 2015-12-30 DIAGNOSIS — J189 Pneumonia, unspecified organism: Secondary | ICD-10-CM | POA: Diagnosis present

## 2015-12-30 DIAGNOSIS — Z885 Allergy status to narcotic agent status: Secondary | ICD-10-CM | POA: Diagnosis not present

## 2015-12-30 DIAGNOSIS — R54 Age-related physical debility: Secondary | ICD-10-CM | POA: Diagnosis present

## 2015-12-30 DIAGNOSIS — Y95 Nosocomial condition: Secondary | ICD-10-CM | POA: Diagnosis present

## 2015-12-30 DIAGNOSIS — F0390 Unspecified dementia without behavioral disturbance: Secondary | ICD-10-CM | POA: Diagnosis present

## 2015-12-30 DIAGNOSIS — J210 Acute bronchiolitis due to respiratory syncytial virus: Secondary | ICD-10-CM | POA: Diagnosis not present

## 2015-12-30 DIAGNOSIS — J9600 Acute respiratory failure, unspecified whether with hypoxia or hypercapnia: Secondary | ICD-10-CM | POA: Diagnosis present

## 2015-12-30 DIAGNOSIS — I482 Chronic atrial fibrillation: Secondary | ICD-10-CM | POA: Diagnosis present

## 2015-12-30 DIAGNOSIS — I083 Combined rheumatic disorders of mitral, aortic and tricuspid valves: Secondary | ICD-10-CM | POA: Diagnosis present

## 2015-12-30 DIAGNOSIS — Z66 Do not resuscitate: Secondary | ICD-10-CM | POA: Diagnosis present

## 2015-12-30 DIAGNOSIS — E43 Unspecified severe protein-calorie malnutrition: Secondary | ICD-10-CM | POA: Diagnosis not present

## 2015-12-30 DIAGNOSIS — Z87891 Personal history of nicotine dependence: Secondary | ICD-10-CM | POA: Diagnosis not present

## 2015-12-30 DIAGNOSIS — N182 Chronic kidney disease, stage 2 (mild): Secondary | ICD-10-CM | POA: Diagnosis present

## 2015-12-30 DIAGNOSIS — E039 Hypothyroidism, unspecified: Secondary | ICD-10-CM | POA: Diagnosis present

## 2015-12-30 DIAGNOSIS — Z7189 Other specified counseling: Secondary | ICD-10-CM | POA: Diagnosis not present

## 2015-12-30 DIAGNOSIS — R9431 Abnormal electrocardiogram [ECG] [EKG]: Secondary | ICD-10-CM | POA: Diagnosis not present

## 2015-12-30 DIAGNOSIS — J9601 Acute respiratory failure with hypoxia: Secondary | ICD-10-CM | POA: Diagnosis not present

## 2015-12-30 DIAGNOSIS — Z6820 Body mass index (BMI) 20.0-20.9, adult: Secondary | ICD-10-CM | POA: Diagnosis not present

## 2015-12-30 DIAGNOSIS — J219 Acute bronchiolitis, unspecified: Secondary | ICD-10-CM | POA: Diagnosis present

## 2015-12-30 DIAGNOSIS — E875 Hyperkalemia: Secondary | ICD-10-CM | POA: Diagnosis present

## 2015-12-30 DIAGNOSIS — I5022 Chronic systolic (congestive) heart failure: Secondary | ICD-10-CM | POA: Diagnosis present

## 2015-12-30 DIAGNOSIS — J441 Chronic obstructive pulmonary disease with (acute) exacerbation: Secondary | ICD-10-CM | POA: Diagnosis present

## 2015-12-30 DIAGNOSIS — R0600 Dyspnea, unspecified: Secondary | ICD-10-CM

## 2015-12-30 DIAGNOSIS — E871 Hypo-osmolality and hyponatremia: Secondary | ICD-10-CM | POA: Diagnosis present

## 2015-12-30 DIAGNOSIS — Z79899 Other long term (current) drug therapy: Secondary | ICD-10-CM | POA: Diagnosis not present

## 2015-12-30 DIAGNOSIS — M25552 Pain in left hip: Secondary | ICD-10-CM | POA: Diagnosis present

## 2015-12-30 DIAGNOSIS — E872 Acidosis: Secondary | ICD-10-CM | POA: Diagnosis present

## 2015-12-30 DIAGNOSIS — Z515 Encounter for palliative care: Secondary | ICD-10-CM | POA: Diagnosis present

## 2015-12-30 DIAGNOSIS — R740 Nonspecific elevation of levels of transaminase and lactic acid dehydrogenase [LDH]: Secondary | ICD-10-CM | POA: Diagnosis present

## 2015-12-30 DIAGNOSIS — J440 Chronic obstructive pulmonary disease with acute lower respiratory infection: Secondary | ICD-10-CM | POA: Diagnosis present

## 2015-12-30 LAB — TSH: TSH: 2.697 u[IU]/mL (ref 0.350–4.500)

## 2015-12-30 LAB — I-STAT ARTERIAL BLOOD GAS, ED
Acid-base deficit: 9 mmol/L — ABNORMAL HIGH (ref 0.0–2.0)
Bicarbonate: 18.1 mEq/L — ABNORMAL LOW (ref 20.0–24.0)
O2 Saturation: 84 %
Patient temperature: 98.6
TCO2: 19 mmol/L (ref 0–100)
pCO2 arterial: 44.1 mmHg (ref 35.0–45.0)
pH, Arterial: 7.22 — ABNORMAL LOW (ref 7.350–7.450)
pO2, Arterial: 59 mmHg — ABNORMAL LOW (ref 80.0–100.0)

## 2015-12-30 LAB — TROPONIN I: Troponin I: 0.05 ng/mL — ABNORMAL HIGH (ref <0.031)

## 2015-12-30 LAB — LACTIC ACID, PLASMA: Lactic Acid, Venous: 1 mmol/L (ref 0.5–2.0)

## 2015-12-30 LAB — CORTISOL: Cortisol, Plasma: 71 ug/dL

## 2015-12-30 LAB — MRSA PCR SCREENING: MRSA by PCR: NEGATIVE

## 2015-12-30 LAB — URINE CULTURE: Culture: NO GROWTH

## 2015-12-30 LAB — LEGIONELLA ANTIGEN, URINE

## 2015-12-30 LAB — PROCALCITONIN: Procalcitonin: 0.27 ng/mL

## 2015-12-30 LAB — HIV ANTIBODY (ROUTINE TESTING W REFLEX): HIV Screen 4th Generation wRfx: NONREACTIVE

## 2015-12-30 MED ORDER — LEVOTHYROXINE SODIUM 25 MCG PO TABS
25.0000 ug | ORAL_TABLET | Freq: Every day | ORAL | Status: DC
  Filled 2015-12-30 (×2): qty 1

## 2015-12-30 MED ORDER — METHYLPREDNISOLONE SODIUM SUCC 125 MG IJ SOLR: 80.0000 mg | Freq: Once | INTRAMUSCULAR | Status: DC

## 2015-12-30 MED ORDER — LEVALBUTEROL HCL 1.25 MG/0.5ML IN NEBU
1.2500 mg | INHALATION_SOLUTION | Freq: Four times a day (QID) | RESPIRATORY_TRACT | Status: DC
  Administered 2015-12-30 – 2015-12-31 (×5): 1.25 mg via RESPIRATORY_TRACT
  Filled 2015-12-30 (×12): qty 0.5

## 2015-12-30 MED ORDER — SODIUM CHLORIDE 0.9 % IV BOLUS (SEPSIS)
500.0000 mL | Freq: Once | INTRAVENOUS | Status: AC
  Administered 2015-12-30: 500 mL via INTRAVENOUS

## 2015-12-30 MED ORDER — PIPERACILLIN-TAZOBACTAM 3.375 G IVPB
3.3750 g | Freq: Three times a day (TID) | INTRAVENOUS | Status: DC
  Administered 2015-12-30 – 2016-01-01 (×5): 3.375 g via INTRAVENOUS
  Filled 2015-12-30 (×7): qty 50

## 2015-12-30 MED ORDER — SODIUM CHLORIDE 0.9 % IV BOLUS (SEPSIS)
250.0000 mL | Freq: Once | INTRAVENOUS | Status: AC
  Administered 2015-12-30: 250 mL via INTRAVENOUS

## 2015-12-30 MED ORDER — METHYLPREDNISOLONE SODIUM SUCC 125 MG IJ SOLR
60.0000 mg | Freq: Four times a day (QID) | INTRAMUSCULAR | Status: DC
  Administered 2015-12-30 – 2015-12-31 (×5): 60 mg via INTRAVENOUS
  Filled 2015-12-30 (×5): qty 2

## 2015-12-30 MED ORDER — VANCOMYCIN HCL 500 MG IV SOLR
500.0000 mg | INTRAVENOUS | Status: DC
  Administered 2015-12-30 – 2015-12-31 (×2): 500 mg via INTRAVENOUS
  Filled 2015-12-30 (×5): qty 500

## 2015-12-30 NOTE — ED Notes (Signed)
Spoke with MD Allyson Sabal regarding family concerns, MD aware and to see pt and speak with family today.  Charge RN aware and spoke with family as well.

## 2015-12-30 NOTE — ED Notes (Signed)
Dr. Baltazar Najjar notified of patient's BP. MD to put orders in.

## 2015-12-30 NOTE — ED Notes (Signed)
Pt moved to hospital bed.

## 2015-12-30 NOTE — ED Notes (Signed)
Pharmacy notified for meds-abx and breathing tx.

## 2015-12-30 NOTE — ED Notes (Signed)
Pt transported to 3W with RN and monitor.  Pt tolerated well.  RN at bedside on arrival.  VSS on arrival to unit, family at bedside with all pt belongings.

## 2015-12-30 NOTE — Progress Notes (Signed)
Pharmacy Antibiotic Follow-up Note  Assessment/Plan: Marilyn Henderson is a 81 y.o.  female admitted on 12/27/2015.  The patient is currently on day #2 of abx for HAP / ARF?Marland Kitchen Currently hypothermic, WBC wnl. SCr 1.14, CrCl ~69ml/min. Was initiated on vanc and zosyn yesterday for code sepsis and then switched to IV Levaquin, but MD would like to switch back to broad spectrum abx for now. Goal VT is 15-68mcg/mL  Plan: Continue Zosyn 3.375 gm IV q8h (4 hour infusion) Continue vancomycin 500mg  IV Q24 Monitor clinical picture, renal function, VT prn F/U C&S, abx deescalation / LOT   Temp (24hrs), Avg:94.5 F (34.7 C), Min:94.5 F (34.7 C), Max:94.5 F (34.7 C)   Recent Labs Lab 01/15/2016 1222  WBC 7.4    Recent Labs Lab 12/23/2015 1222 01/18/2016 1612  CREATININE 1.18* 1.14*   Estimated Creatinine Clearance: 28.2 mL/min (by C-G formula based on Cr of 1.14).    Allergies  Allergen Reactions  . Codeine   . Paxil [Paroxetine Hcl]     Altered mental status    Antimicrobials this admission: 1/10 Zosyn >>  1/10 Vancomycin >>  1/10 Levaquin >>  Microbiology results: 1/10 BCx: > sent 1/10 UCx: > sent  1/10 RespCx > no result  Thank you for allowing pharmacy to be a part of this patient's care.  Elenor Quinones, PharmD, BCPS Clinical Pharmacist Pager [phone] 12/30/2015 8:02 AM

## 2015-12-30 NOTE — ED Notes (Signed)
MD Merrell at bedside. 

## 2015-12-30 NOTE — ED Notes (Signed)
Pt resting comfortably at this time, NAD. Family at bedside

## 2015-12-30 NOTE — Progress Notes (Signed)
  Echocardiogram 2D Echocardiogram has been performed.  Marilyn Henderson 12/30/2015, 8:14 AM

## 2015-12-30 NOTE — Consult Note (Signed)
WOC wound consult note Reason for Consult: Consult requested for right leg, which is wrapped in an Trail and coban.  Daughter at the bedside states this is to reduce edema and is changed 2 times a week prior to admission. Wound type: Removed Una boot to assess right leg.  No open wounds or drainage, previous edema has resolved. Dressing procedure/placement/frequency: Paged ortho tech to re-apply [employer] and coban and they can change Q Wed and Fri. Pt can resume home health after discharge for compression wrap changes. Daughter denies further questions at this time. Please re-consult if further assistance is needed.  Thank-you,  Julien Girt MSN, [place], [place], [place], [place]

## 2015-12-30 NOTE — ED Notes (Signed)
MD Abrol paged and notified regarding temp.

## 2015-12-30 NOTE — ED Notes (Signed)
Ortho tech at bedside 

## 2015-12-30 NOTE — ED Notes (Signed)
Echo being done at this time

## 2015-12-30 NOTE — ED Notes (Signed)
MD Abrol at bedside

## 2015-12-30 NOTE — Progress Notes (Addendum)
Marilyn Henderson NOTE  Marilyn Henderson [id number] DOB: October 05, 1923 DOA: 12/25/2015 PCP: Lottie Dawson, MD  Length of stay: 1   Assessment/Plan: Principal Problem:   Acute bronchiolitis Active Problems:   Hypothyroidism   COPD (chronic obstructive pulmonary disease) ([place])   Acute respiratory failure with hypoxia ([place])   Dementia   Dehydration with hyponatremia   History of CHF (congestive heart failure)   Acute hyperkalemia   CKD (chronic kidney disease), stage II   Abnormal EKG   Elevated lactic acid level   Chronic atrial fibrillation (HCC)   HPI: This is a 82 year old female patient recently moved to the [place] area about 2 months ago from [place] who has a past medical history of COPD and recurrent chronic bronchitis/bronchiectasis, remote hemoptysis, CHF not specified, chronic atrial fibrillation not on anticoagulation, hypothyroidism, dementia and stage II chronic kidney disease. History obtained from patient's daughter at bedside due to patient's underlying acute respiratory failure and need for BiPAP. Patient began having respiratory symptoms on Friday with cough of clear productive sputum. She continued have constant coughing. Her daughter came to [facility] independent living to continue to monitor her mother. She reports at that time nursing checked the patient and her oxygen levels, BP and temperature were okay. Sunday she was taken to a local urgent care where an x-ray was done that showed no evidence of pneumonia. She was given a nebulizer treatment and empiric Z-Pak.  Unfortunately by Monday she worsened and began having increasing yellow productive sputum and increasing malaise and poor appetite. She also developed increasing anxiety. During this entire time she had not run any fevers. Because of concerns over dehydration her daughter held her Lasix today but had given her usual heart medications. Because of nausea her iron was not given.  Because she continued to do poorly and was hypoxemic today the daughter requested the patient be evaluated by primary physician Dr. Felipa Eth.  Her  daughter reports that baseline blood pressure readings for this patient are anywhere between 90/60 and 100/70.    Subjective  Oral temperature 94.5, initial BP 85/65 with a heart rate of 104, room air saturations 84%. After fluid challenges blood pressure has come up to 109/86 with a pulse of 97 respirations 16. Patient currently on 6 L of oxygen  Assessment and plan  Acute hypoxemic respiratory failure   /Acute bronchiolitis/ COPD  Admit to step down, meets  sepsis  Criteria , qSOFA = 2 X-ray shows right lower lobe pneumonia, lactic acid normal, pro-calcitonin <0.10 Initiated on broad-spectrum antibiotics Held BiPAP due to somnolence and increased risk of recurrent aspiration, continue nothing by mouth -History of atrial fibrillation and recent tachycardia so we'll utilize Xopenex for nebs ABG shows pH 7.2 , PCO2 44, P O2 59.0  Hypothermia, hyponatremia, hyperkalemia Suspected adrenal insufficiency, check cortisol level Started on Solu-Medrol to help with respiratory symptoms , no history of recent steroid use     Acute hyperkalemia -Secondary to acute dehydration and acute renal failure -Holding preadmission Lasix and ACE inhibitor -Follow-up electrolyte panel post volume resuscitation in ER -K+ has decreased to 5.1 so we will continue volume resuscitation   Elevated lactic acid level -Secondary to acute renal failure and volume depletion -Lactic acid has improved after hydration and patient has low normal blood pressure readings at baseline and upon review of vital signs and other laboratory data does not meet criteria for sepsis   Dehydration with hyponatremia -Sodium has increased with volume resuscitation in the ER -Continue IV  fluids at 100 mL per hour -Repeat labs in a.m.   Acute renal failure /CKD, stage II -Baseline  creatinine unknown but upon review of records from care everywhere documented with stage II chronic kidney disease -Renal function slightly improved after following repletion -Follow labs -Avoid nephrotoxic medications   Chronic atrial fibrillation  -Family confirms this is chronic diagnoses -Previously followed by cardiologist in [place] -Currently rate controlled -Carvedilol on hold until euvolemic -CHADVASc = 5 and was not on anticoagulation prior to admission Check 2-D echo   Hypothyroidism -Continue Synthroid TSH normal   Dementia -Progressive issue for several years, concern for recurrent aspiration for several months SLP eval    History of CHF , [id number] -chronic systolic HF ,no exacerbation,   -Family reports last echo greater than 1 year ago so we'll obtain echocardiogram this admission -Watch closely with volume resuscitation   Abnormal EKG -Does have subtle ST segment changes in lateral leads -No prior EKG for comparison but under care everywhere a previous read-out of an EKG from 2011 reads as completely normal -Repeat EKG in a.m. -Initial troponin within normal limits    DVT prophylaxsis  Lovenox  Code Status:      Code Status Orders        Start     Ordered   01/17/2016 1816  Do not attempt resuscitation (DNR)   Continuous    Question Answer Comment  In the event of cardiac or respiratory ARREST Do not call a "code blue"   In the event of cardiac or respiratory ARREST Do not perform Intubation, CPR, defibrillation or ACLS   In the event of cardiac or respiratory ARREST Use medication by any route, position, wound care, and other measures to relive pain and suffering. May use oxygen, suction and manual treatment of airway obstruction as needed for comfort.      01/08/2016 1815    Code Status History    Date Active Date Inactive Code Status Order ID Comments User Context   12/23/2015  4:34 PM 01/19/2016  6:15 PM DNR [id number]  Samella Parr, NP ED    12/22/2015  4:18 PM   4:34 PM Full Code [id number]  Samella Parr, NP ED      Family Communication: Discussed in detail with the patient/2 daughters, all imaging results, lab results explained to the patient   Disposition Plan:  Palliative care consultation ordered for family support , very anxious daughters     Consultants:  Palliative care    Procedures:  None  Antibiotics: Anti-infectives    Start     Dose/Rate Route Frequency Ordered Stop   12/31/15 2100  levofloxacin (LEVAQUIN) IVPB 500 mg  Status:  Discontinued     500 mg 100 mL/hr over 60 Minutes Intravenous Every 48 hours 12/26/2015 1821 12/30/15 0730   12/30/15 1400  vancomycin (VANCOCIN) 500 mg in sodium chloride 0.9 % 100 mL IVPB  Status:  Discontinued     500 mg 100 mL/hr over 60 Minutes Intravenous Every 24 hours 01/13/2016 1309 12/28/2015 1815   12/30/15 1300  vancomycin (VANCOCIN) 500 mg in sodium chloride 0.9 % 100 mL IVPB     500 mg 100 mL/hr over 60 Minutes Intravenous Every 24 hours 12/30/15 0757     12/30/15 0800  piperacillin-tazobactam (ZOSYN) IVPB 3.375 g     3.375 g 12.5 mL/hr over 240 Minutes Intravenous Every 8 hours 12/30/15 0757     01/12/2016 2100  levofloxacin (LEVAQUIN) IVPB 750 mg  750 mg 100 mL/hr over 90 Minutes Intravenous  Once 12/28/2015 1821 01/11/2016 2250   01/12/2016 2000  piperacillin-tazobactam (ZOSYN) IVPB 3.375 g  Status:  Discontinued     3.375 g 12.5 mL/hr over 240 Minutes Intravenous Every 8 hours 12/30/2015 1309 01/19/2016 1815   12/22/2015 1830  levofloxacin (LEVAQUIN) IVPB 750 mg  Status:  Discontinued     750 mg 100 mL/hr over 90 Minutes Intravenous Every 24 hours 12/21/2015 1815 01/14/2016 1821   12/31/2015 1315  piperacillin-tazobactam (ZOSYN) IVPB 3.375 g     3.375 g 100 mL/hr over 30 Minutes Intravenous  Once 12/26/2015 1303 01/14/2016 1412   12/28/2015 1315  vancomycin (VANCOCIN) IVPB 1000 mg/200 mL premix     1,000 mg 200 mL/hr over 60 Minutes Intravenous  Once 01/12/2016 1303   1435          Objective: Filed Vitals:   12/30/15 1235 12/30/15 1245 12/30/15 1300 12/30/15 1305  BP:    93/67  Pulse:  56    Temp: 95.6 F (35.3 C)     TempSrc: Rectal     Resp:  13 14 20   Height:      Weight:      SpO2:  100% 95% 99%    Intake/Output Summary (Last 24 hours) at 12/30/15 1330 Last data filed at 12/30/15 0717  Gross per 24 hour  Intake   1500 ml  Output      0 ml  Net   1500 ml    Exam:  General: In moderate acute respiratory distress that has improved with application of BiPAP, patient appears frail and malnourished  Psych: Anxious and confused affect, oriented to name only; able to follow simple commands easily  Neuro: No focal neurological deficits, CN II through XII intact, Strength 4/5 all 4 extremities, Sensation intact all 4 extremities.  ENT: Ears and Eyes appear Normal, Conjunctivae clear, PER. Dry oral mucosa without erythema or exudates.  Neck: Supple, No lymphadenopathy appreciated  Respiratory: Symmetrical chest wall movement, diminished air movement bilaterally though improved after application of BiPAP, coarse to auscultation bilaterally   Cardiac: Irregular with underlying atrial fibrillation, No Murmurs, no LE edema noted, no JVD, No carotid bruits, peripheral pulses palpable at 2+  Abdomen: Positive bowel sounds, Soft, Non tender, Non distended, No masses appreciated, no obvious hepatosplenomegaly  Data Review   Micro Results Recent Results (from the past 240 hour(s))  Blood Culture (routine x 2)     Status: None (Preliminary result)   Collection Time: 12/31/2015  1:00 PM  Result Value Ref Range Status   Specimen Description BLOOD BLOOD RIGHT FOREARM  Final   Special Requests BOTTLES DRAWN AEROBIC AND ANAEROBIC 5CC  Final   Culture NO GROWTH < 24 HOURS  Final   Report Status PENDING  Incomplete  Blood Culture (routine x 2)     Status: None (Preliminary result)   Collection Time: 12/28/2015  1:41 PM  Result  Value Ref Range Status   Specimen Description BLOOD RIGHT ANTECUBITAL  Final   Special Requests IN PEDIATRIC BOTTLE 6CC  Final   Culture NO GROWTH < 24 HOURS  Final   Report Status PENDING  Incomplete  Urine culture     Status: None (Preliminary result)   Collection Time: 01/05/2016  4:45 PM  Result Value Ref Range Status   Specimen Description URINE, CATHETERIZED  Final   Special Requests NONE  Final   Culture NO GROWTH < 24 HOURS  Final   Report Status PENDING  Incomplete  Culture, respiratory (NON-Expectorated)     Status: None (Preliminary result)   Collection Time: 01/13/2016  8:00 PM  Result Value Ref Range Status   Specimen Description TRACHEAL ASPIRATE  Final   Special Requests NONE  Final   Gram Stain   Final    FEW WBC PRESENT, PREDOMINANTLY MONONUCLEAR ABUNDANT SQUAMOUS EPITHELIAL CELLS PRESENT MODERATE GRAM POSITIVE RODS MODERATE GRAM NEGATIVE RODS FEW GRAM POSITIVE COCCI    Culture PENDING  Incomplete   Report Status PENDING  Incomplete    Radiology Reports Dg Chest Port 1 View  12/30/2015  CLINICAL DATA:  Acute respiratory failure.  Sepsis. EXAM: PORTABLE CHEST 1 VIEW COMPARISON:  Single view of the chest 12/31/2015. FINDINGS: There is new airspace disease in the right lung base. The left lung is clear. Heart size is enlarged. No pneumothorax or pleural effusion. IMPRESSION: New right basilar airspace disease could be due to atelectasis or pneumonia. Electronically Signed   By: Inge Rise M.D.   On: 12/30/2015 08:38   Dg Chest Port 1 View  01/11/2016  CLINICAL DATA:  Sepsis. EXAM: PORTABLE CHEST 1 VIEW COMPARISON:  None. FINDINGS: There is hyperinflation and interstitial coarsening correlating with patient's history of COPD. Cardiomegaly and aortic tortuosity without suspected edema. No effusion or pneumothorax. No focal consolidation. IMPRESSION: 1. COPD findings without definite superimposed pneumonia. 2. Cardiomegaly. Electronically Signed   By: Monte Fantasia  M.D.   On: 01/17/2016 13:55     CBC  Recent Labs Lab 12/31/2015 1222  WBC 7.4  HGB 12.3  HCT 38.6  PLT 155  MCV 90.4  MCH 28.8  MCHC 31.9  RDW 18.9*  LYMPHSABS 0.5*  MONOABS 0.4  EOSABS 0.0  BASOSABS 0.0    Chemistries   Recent Labs Lab 12/20/2015 1222 12/28/2015 1607 12/22/2015 1612  NA 128*  --  130*  K 5.3*  --  5.1  CL 96*  --  99*  CO2 21*  --  22  GLUCOSE 89  --  103*  BUN 49*  --  47*  CREATININE 1.18*  --  1.14*  CALCIUM 8.9  --  8.4*  AST  --  47*  --   ALT  --  128*  --   ALKPHOS  --  75  --   BILITOT  --  0.9  --    ------------------------------------------------------------------------------------------------------------------ estimated creatinine clearance is 28.2 mL/min (by C-G formula based on Cr of 1.14). ------------------------------------------------------------------------------------------------------------------ No results for input(s): HGBA1C in the last 72 hours. ------------------------------------------------------------------------------------------------------------------ No results for input(s): CHOL, HDL, LDLCALC, TRIG, CHOLHDL, LDLDIRECT in the last 72 hours. ------------------------------------------------------------------------------------------------------------------  Recent Labs  12/30/15 0544  TSH 2.697   ------------------------------------------------------------------------------------------------------------------ No results for input(s): VITAMINB12, FOLATE, FERRITIN, TIBC, IRON, RETICCTPCT in the last 72 hours.  Coagulation profile No results for input(s): INR, PROTIME in the last 168 hours.  No results for input(s): DDIMER in the last 72 hours.  Cardiac Enzymes  Recent Labs Lab 12/30/15 1155  TROPONINI 0.05*   ------------------------------------------------------------------------------------------------------------------ Invalid input(s): POCBNP   CBG: No results for input(s): GLUCAP in the last 168  hours.     Studies: Dg Chest Port 1 View  12/30/2015  CLINICAL DATA:  Acute respiratory failure.  Sepsis. EXAM: PORTABLE CHEST 1 VIEW COMPARISON:  Single view of the chest 12/26/2015. FINDINGS: There is new airspace disease in the right lung base. The left lung is clear. Heart size is enlarged. No pneumothorax or pleural effusion. IMPRESSION: New right basilar airspace disease could be due to  atelectasis or pneumonia. Electronically Signed   By: Inge Rise M.D.   On: 12/30/2015 08:38   Dg Chest Port 1 View  01/05/2016  CLINICAL DATA:  Sepsis. EXAM: PORTABLE CHEST 1 VIEW COMPARISON:  None. FINDINGS: There is hyperinflation and interstitial coarsening correlating with patient's history of COPD. Cardiomegaly and aortic tortuosity without suspected edema. No effusion or pneumothorax. No focal consolidation. IMPRESSION: 1. COPD findings without definite superimposed pneumonia. 2. Cardiomegaly. Electronically Signed   By: Monte Fantasia M.D.   On: 12/20/2015 13:55      No results found for: HGBA1C Lab Results  Component Value Date   CREATININE 1.14* 01/16/2016       Scheduled Meds: . cycloSPORINE  1 drop Both Eyes BID  . enoxaparin (LOVENOX) injection  30 mg Subcutaneous Daily  . levalbuterol  1.25 mg Nebulization 4 times per day  . levothyroxine  25 mcg Oral QAC breakfast  . methylPREDNISolone (SOLU-MEDROL) injection  60 mg Intravenous Q6H   Continuous Infusions: . sodium chloride 100 mL/hr at 12/30/15 0033  . piperacillin-tazobactam (ZOSYN)  IV 3.375 g (12/30/15 0844)  . vancomycin      Principal Problem:   Acute bronchiolitis Active Problems:   Hypothyroidism   COPD (chronic obstructive pulmonary disease) ([facility])   Acute respiratory failure with hypoxia ([facility])   Dementia   Dehydration with hyponatremia   History of CHF (congestive heart failure)   Acute hyperkalemia   CKD (chronic kidney disease), stage II   Abnormal EKG   Elevated lactic acid level   Chronic atrial  fibrillation ([place])    Time spent: 45 minutes   [facility] Pager [phone]. If 7PM-7AM, please contact night-coverage at [email or web address], password [facility] 12/30/2015, 1:30 PM  LOS: 1 day

## 2015-12-31 ENCOUNTER — Inpatient Hospital Stay (HOSPITAL_COMMUNITY): Payer: Medicare Other

## 2015-12-31 ENCOUNTER — Encounter (HOSPITAL_COMMUNITY): Payer: Self-pay | Admitting: General Practice

## 2015-12-31 DIAGNOSIS — Z7189 Other specified counseling: Secondary | ICD-10-CM

## 2015-12-31 DIAGNOSIS — R9431 Abnormal electrocardiogram [ECG] [EKG]: Secondary | ICD-10-CM

## 2015-12-31 DIAGNOSIS — J9601 Acute respiratory failure with hypoxia: Secondary | ICD-10-CM

## 2015-12-31 DIAGNOSIS — F0390 Unspecified dementia without behavioral disturbance: Secondary | ICD-10-CM

## 2015-12-31 DIAGNOSIS — I482 Chronic atrial fibrillation: Secondary | ICD-10-CM

## 2015-12-31 DIAGNOSIS — Z515 Encounter for palliative care: Secondary | ICD-10-CM | POA: Insufficient documentation

## 2015-12-31 DIAGNOSIS — J189 Pneumonia, unspecified organism: Secondary | ICD-10-CM

## 2015-12-31 LAB — COMPREHENSIVE METABOLIC PANEL
ALT: 142 U/L — ABNORMAL HIGH (ref 14–54)
AST: 47 U/L — ABNORMAL HIGH (ref 15–41)
Albumin: 3.1 g/dL — ABNORMAL LOW (ref 3.5–5.0)
Alkaline Phosphatase: 71 U/L (ref 38–126)
Anion gap: 11 (ref 5–15)
BUN: 80 mg/dL — ABNORMAL HIGH (ref 6–20)
CO2: 18 mmol/L — ABNORMAL LOW (ref 22–32)
Calcium: 8.4 mg/dL — ABNORMAL LOW (ref 8.9–10.3)
Chloride: 106 mmol/L (ref 101–111)
Creatinine, Ser: 1.84 mg/dL — ABNORMAL HIGH (ref 0.44–1.00)
GFR calc Af Amer: 27 mL/min — ABNORMAL LOW (ref >60)
GFR calc non Af Amer: 23 mL/min — ABNORMAL LOW (ref >60)
Glucose, Bld: 153 mg/dL — ABNORMAL HIGH (ref 65–99)
Potassium: 5.8 mmol/L — ABNORMAL HIGH (ref 3.5–5.1)
Sodium: 135 mmol/L (ref 135–145)
Total Bilirubin: 1 mg/dL (ref 0.3–1.2)
Total Protein: 5.8 g/dL — ABNORMAL LOW (ref 6.5–8.1)

## 2015-12-31 LAB — CBC
HCT: 37.8 % (ref 36.0–46.0)
Hemoglobin: 11.7 g/dL — ABNORMAL LOW (ref 12.0–15.0)
MCH: 28.4 pg (ref 26.0–34.0)
MCHC: 31 g/dL (ref 30.0–36.0)
MCV: 91.7 fL (ref 78.0–100.0)
Platelets: 152 10*3/uL (ref 150–400)
RBC: 4.12 MIL/uL (ref 3.87–5.11)
RDW: 19.4 % — ABNORMAL HIGH (ref 11.5–15.5)
WBC: 10.9 10*3/uL — ABNORMAL HIGH (ref 4.0–10.5)

## 2015-12-31 LAB — LACTIC ACID, PLASMA: Lactic Acid, Venous: 0.9 mmol/L (ref 0.5–2.0)

## 2015-12-31 LAB — PROCALCITONIN: Procalcitonin: 0.35 ng/mL

## 2015-12-31 MED ORDER — GLYCOPYRROLATE 0.2 MG/ML IJ SOLN
0.2000 mg | INTRAMUSCULAR | Status: DC | PRN
  Filled 2015-12-31: qty 1

## 2015-12-31 MED ORDER — TRAMADOL HCL 50 MG PO TABS
50.0000 mg | ORAL_TABLET | Freq: Four times a day (QID) | ORAL | Status: DC | PRN
  Administered 2015-12-31: 50 mg via ORAL
  Filled 2015-12-31: qty 1

## 2015-12-31 MED ORDER — OXYCODONE HCL 5 MG PO TABS: 5.0000 mg | ORAL_TABLET | Freq: Four times a day (QID) | ORAL | Status: DC | PRN

## 2015-12-31 MED ORDER — METHYLPREDNISOLONE SODIUM SUCC 125 MG IJ SOLR: 60.0000 mg | Freq: Two times a day (BID) | INTRAMUSCULAR | Status: DC

## 2015-12-31 MED ORDER — ONDANSETRON HCL 4 MG/2ML IJ SOLN: 4.0000 mg | Freq: Four times a day (QID) | INTRAMUSCULAR | Status: DC | PRN

## 2015-12-31 MED ORDER — MORPHINE SULFATE (PF) 2 MG/ML IV SOLN: 1.0000 mg | INTRAVENOUS | Status: DC | PRN

## 2015-12-31 MED ORDER — LORAZEPAM 2 MG/ML IJ SOLN
1.0000 mg | INTRAMUSCULAR | Status: DC | PRN
  Administered 2015-12-31: 1 mg via INTRAVENOUS
  Filled 2015-12-31: qty 1

## 2015-12-31 MED ORDER — RESOURCE THICKENUP CLEAR PO POWD
ORAL | Status: DC | PRN
  Filled 2015-12-31: qty 125

## 2015-12-31 MED ORDER — MORPHINE BOLUS VIA INFUSION
2.0000 mg | INTRAVENOUS | Status: DC | PRN
  Filled 2015-12-31: qty 2

## 2015-12-31 MED ORDER — SODIUM CHLORIDE 0.9 % IV BOLUS (SEPSIS)
250.0000 mL | Freq: Once | INTRAVENOUS | Status: AC
  Administered 2015-12-31: 250 mL via INTRAVENOUS

## 2015-12-31 MED ORDER — HYOSCYAMINE SULFATE 0.125 MG/ML PO SOLN: 0.1250 mg | ORAL | Status: DC | PRN

## 2015-12-31 MED ORDER — BIOTENE DRY MOUTH MT LIQD: 15.0000 mL | OROMUCOSAL | Status: DC | PRN

## 2015-12-31 MED ORDER — GLYCOPYRROLATE 1 MG PO TABS
1.0000 mg | ORAL_TABLET | ORAL | Status: DC | PRN
  Filled 2015-12-31: qty 1

## 2015-12-31 MED ORDER — LORAZEPAM 2 MG/ML PO CONC: 1.0000 mg | ORAL | Status: DC | PRN

## 2015-12-31 MED ORDER — MORPHINE SULFATE 25 MG/ML IV SOLN
0.0000 mg/h | INTRAVENOUS | Status: DC
  Administered 2015-12-31: 1 mg/h via INTRAVENOUS
  Filled 2015-12-31: qty 10

## 2015-12-31 MED ORDER — LORAZEPAM 2 MG/ML IJ SOLN: 0.5000 mg | INTRAMUSCULAR | Status: DC | PRN

## 2015-12-31 MED ORDER — LORAZEPAM 1 MG PO TABS: 1.0000 mg | ORAL_TABLET | ORAL | Status: DC | PRN

## 2015-12-31 MED ORDER — POLYVINYL ALCOHOL 1.4 % OP SOLN
1.0000 [drp] | Freq: Four times a day (QID) | OPHTHALMIC | Status: DC | PRN
  Filled 2015-12-31: qty 15

## 2015-12-31 MED ORDER — LIDOCAINE 5 % EX PTCH
1.0000 | MEDICATED_PATCH | CUTANEOUS | Status: DC
  Administered 2015-12-31: 1 via TRANSDERMAL
  Filled 2015-12-31: qty 1

## 2015-12-31 MED ORDER — ONDANSETRON 4 MG PO TBDP
4.0000 mg | ORAL_TABLET | Freq: Four times a day (QID) | ORAL | Status: DC | PRN
  Filled 2015-12-31: qty 1

## 2015-12-31 NOTE — Consult Note (Signed)
Name: Marilyn Henderson MRN: [id number] DOB: 11/02/1923    ADMISSION DATE:  01/07/2016 CONSULTATION DATE:  12/31/15  REFERRING MD :  Allyson Sabal  CHIEF COMPLAINT:  Cough  BRIEF PATIENT DESCRIPTION: 82 y.o. female from independent living facility, brought to [facility] 01/10 with acute hypoxic resp failure and HCAP.  Has had hypotension (hough chronically hypotensive) which worsened 01/12. PCCM called 01/12 for further recs.  Of note, pt full DNR / DNI.  SIGNIFICANT EVENTS  01/10 > admitted. 01/12 > wound care consulted for right leg edema (unna boot placed). 01/12 > palliative care consulted.  Decision made to hold off on palliative care / hospice and continue with current care (while maintaining DNR) in hopes pt could return to SNF with PT, etc. 01/12 > PCCM consulted by [facility] for persistent hypotension.  STUDIES:  CXR 01/10 > COPD, cardiomegaly. CXR 01/12 > CHF, progressed since prior study. Echo 01/11 > EF 25-30%, diffuse hypokinesis.  Mild AR, mild MR.  Severe TR. Severe RV / RA dilation.    HISTORY OF PRESENT ILLNESS: Pt is encephelopathic; therefore, this HPI is obtained from chart review. Ghadeer Saulter is a 82 y.o. female with a PMH as outlined below.  She moved to [place] just some 2 months ago from [place], [place] and she now resides in an independent living facility [facility]).  Per pt's daughters, she was still functional to some extend and although she has had some decline in her health, this has not been significant.  She does have some degree of dementia and per chart review, this has been a progressive issue for several years and there has been concern for recurrent aspiration for several months. On Friday, 12/25/15, she had significant cough and hoarseness.  This did not improve on Sunday 12/27/15; therefore, she was taken to urgent care where she was diagnosed with working diagnosis of bronchitis for which she was given Z-pack.  The following day, she had worsening of symptoms along with  increase in sputum production and increase in anxiety level.  Her daughter felt that she needed to be evaluated by PCP (Dr. Felipa Eth) where they were unable to obtain adequate pulse ox reading.  She was subsequently send to ED for further evaluation.  In ED, she was hypotensive (though family reported she is normally hypotensive at baseline).  She did require BiPAP for hypoxia and work of breathing and was admitted for with concern for HCAP.  Discussions were had with family regarding code status and orders were placed for DNR / DNI.  On 01/12, she got progressively more hypotensive and hypoxic and later became more somnolent with AMS.  Per floor staff, [facility] MD called but apparently not at bedside.   PCCM was consulted for further recs.   PAST MEDICAL HISTORY :   has a past medical history of CHF (congestive heart failure) ([place]); COPD (chronic obstructive pulmonary disease) ([place]); Pneumonia (12/2015); AF (atrial fibrillation) ([place]); Chronic kidney disease; Hypothyroidism; Dementia; and Cancer ([place]).  has past surgical history that includes Colon surgery and Cataract extraction. Prior to Admission medications   Medication Sig Start Date End Date Taking? Authorizing Provider  albuterol (PROAIR HFA) 108 (90 Base) MCG/ACT inhaler Inhale 2 puffs into the lungs every 6 (six) hours as needed for wheezing or shortness of breath.  08/12/15  Yes Historical Provider, MD  ALPRAZolam Duanne Moron) 0.5 MG tablet Take 0.25 mg by mouth at bedtime as needed for anxiety or sleep.    Yes Historical Provider, MD  benzonatate (TESSALON) 100 MG capsule  Take 100 mg by mouth 2 (two) times daily as needed for cough.  12/27/15  Yes Historical Provider, MD  Calcium-Vitamin D-Vitamin K (VIACTIV) [id number] MG-UNT-MCG CHEW Chew 1 tablet by mouth daily.   Yes Historical Provider, MD  carvedilol (COREG) 6.25 MG tablet Take 6.25 mg by mouth 2 (two) times daily. 10/22/15  Yes Historical Provider, MD  cycloSPORINE (RESTASIS) 0.05 % ophthalmic  emulsion Place 1 drop into both eyes 2 (two) times daily. 03/28/14  Yes Historical Provider, MD  ferrous sulfate 325 (65 FE) MG tablet Take 325 mg by mouth daily.   Yes Historical Provider, MD  furosemide (LASIX) 20 MG tablet Take 20 mg by mouth every other day. 11/06/15  Yes Historical Provider, MD  furosemide (LASIX) 40 MG tablet Take 40 mg by mouth every other day. 11/06/15  Yes Historical Provider, MD  guaiFENesin (MUCINEX) 600 MG 12 hr tablet Take 600 mg by mouth 2 (two) times daily as needed for cough or to loosen phlegm.  12/25/09  Yes Historical Provider, MD  lisinopril (PRINIVIL,ZESTRIL) 2.5 MG tablet Take 2.5 mg by mouth daily. 10/18/15  Yes Historical Provider, MD  Multiple Vitamin (MULTIVITAMIN WITH MINERALS) TABS tablet Take 1 tablet by mouth daily.   Yes Historical Provider, MD  SYNTHROID 25 MCG tablet Take 25 mcg by mouth daily. 11/05/15  Yes Historical Provider, MD   Allergies  Allergen Reactions  . Codeine   . Paxil [Paroxetine Hcl]     Altered mental status    FAMILY HISTORY:  family history includes COPD in her sister; Heart disease in her father and sister. SOCIAL HISTORY:  reports that she has quit smoking. She has never used smokeless tobacco. She reports that she does not drink alcohol or use illicit drugs.  REVIEW OF SYSTEMS:  Unable to obtain as pt is encephalopathic.   SUBJECTIVE:   VITAL SIGNS: Temp:  [96.5 F (35.8 C)-97.4 F (36.3 C)] 96.5 F (35.8 C) (01/12 1816) Pulse Rate:  [52-134] 134 (01/12 1816) Resp:  [14-22] 18 (01/12 1816) BP: (76-92)/(56-75) 85/66 mmHg (01/12 1810) SpO2:  [84 %-100 %] 84 % (01/12 1816)  PHYSICAL EXAMINATION: General: Elderly chronically ill appearing female, resting in bed, in NAD. Neuro: Awake, does not answer questions or follow commands, moves extremities, moaning incomprehensible words. HEENT: [place]/AT. PERRL, arcus senilis. Cardiovascular: IRIR, 2/6 SEM. Lungs: Respirations shallow.  Coarse BS bilaterally. Abdomen: BS x  4, soft, NT/ND.  Musculoskeletal: No gross deformities, no edema.  R leg wrapped in unna boot. Skin: Intact, warm, no rashes.     Recent Labs Lab 01/17/2016 1222 12/24/2015 1612  NA 128* 130*  K 5.3* 5.1  CL 96* 99*  CO2 21* 22  BUN 49* 47*  CREATININE 1.18* 1.14*  GLUCOSE 89 103*    Recent Labs Lab 01/12/2016 1222  HGB 12.3  HCT 38.6  WBC 7.4  PLT 155   Dg Chest Port 1 View  12/31/2015  CLINICAL DATA:  Shortness of breath EXAM: PORTABLE CHEST 1 VIEW COMPARISON:  12/30/2015 FINDINGS: Cardiomegaly. Diffuse interstitial coarsening which is increased from prior, with new cephalized blood flow. Small pleural effusions. Vascular pedicle widening with stable aortic contour. IMPRESSION: CHF pattern which has progressed since yesterday. Electronically Signed   By: Monte Fantasia M.D.   On: 12/31/2015 16:49   Dg Chest Port 1 View  12/30/2015  CLINICAL DATA:  Acute respiratory failure.  Sepsis. EXAM: PORTABLE CHEST 1 VIEW COMPARISON:  Single view of the chest 12/28/2015. FINDINGS: There is new airspace disease  in the right lung base. The left lung is clear. Heart size is enlarged. No pneumothorax or pleural effusion. IMPRESSION: New right basilar airspace disease could be due to atelectasis or pneumonia. Electronically Signed   By: Inge Rise M.D.   On: 12/30/2015 08:38   Dg Hip Unilat With Pelvis 2-3 Views Left  12/31/2015  CLINICAL DATA:  Left hip pain for 2 days EXAM: DG HIP (WITH OR WITHOUT PELVIS) 2-3V LEFT COMPARISON:  None FINDINGS: There is no evidence of hip fracture or dislocation. There is no evidence of arthropathy or other focal bone abnormality. Scoliosis noted within the lumbar spine. IMPRESSION: 1. No acute findings. 2. If there is high clinical suspicion for occult fracture or the patient refuses to weightbear, consider further evaluation with MRI. Although CT is expeditious, evidence is lacking regarding accuracy of CT over plain film radiography. Electronically Signed    By: Kerby Moors M.D.   On: 12/31/2015 15:40    ASSESSMENT / PLAN:  Acute hypoxic respiratory failure - multifactorial due to items listed below. HCAP. Probable chronic occult aspiration. Pulmonary edema - due to underlying severe CHF and valvular disease. COPD by report - no PFT's on file.  DNI Status. Plan: Continue supplemental O2 as needed to maintain SpO2 > 90%. D/c BiPAP given her inability to manage secretions effectively. Continue empiric abx (per family request). Follow cultures. Diuresis restricted due to hypotension. Continue levalbuterol. D/c steroids.. Absolutely agree against intubation.  Hypotension - pt is chronically hypotensive with SBP ~ 90 per granddaughter; currently SBP in low 70's.  Suspect this is multifactorial in the setting of her significant cardiac disease (sCHF [EF 25-30%], valvular disease), as well as her HCAP and decreased PO intake / dehydration. Troponin leak. Acute on chronic systolic heart failure - Echo from 01/11 with EF 25-30% and diffuse hypokinesis. Severe RV / RA dilation.  Valvular heart disease - Echo from 01/11 revealed mild AR, mild MR, severe TR.  Chronic A.fib - not on anticoagulation. DNR Status. Plan: IVF resuscitation limited due to significant systolic heart failure and valvular disease. Vasopressors would be of no benefit under these circumstances given her co-morbidities.  In fact, vasopressors pose a realistic risk of lethal arrhythmias given her significant heart disease. Carvedilol, furosemide, lisinopril (outpatient meds) restricted due to hypotension. Absolutely agree no CPR / ACLS under any circumstances as would cause more harm including certain rib fractures. Full comfort orders entered after extensive family discussions (see below).   Extensive bedside discussion with family including two daughters, granddaughter, grandson regarding the interval events, current circumstances and poor prognosis.  We also discussed  patient's prior wishes under circumstances such as this.The family has decided to offer full comfort care for Mrs. Grandfield. They are aware that the patient may be transferred to the palliative care floor for continued comfort care needs. They have been fully updated on the process and expectations and all questions have been answered.  Family has requested chaplain support and also for palliative care to visit with family in the AM.  Primary team, please ensure palliative care is called in the AM.   PCCM will sign off.  Please do not hesitate to call us back if we can be of any further assistance.  CC time including multiple family discussions:  75 minutes.   Montey Hora, [facility] Pager: [phone]  or [phone] 12/31/2015, 6:31 PM

## 2015-12-31 NOTE — Progress Notes (Addendum)
Marilyn Henderson NOTE  Marilyn Henderson [id number] DOB: 03/04/1923 DOA: 01/16/2016 PCP: Marilyn Dawson, MD  Length of stay: 2   Assessment/Plan: Principal Problem:   HCAP (healthcare-associated pneumonia) Active Problems:   Acute bronchiolitis   Hypothyroidism   COPD (chronic obstructive pulmonary disease) ([place])   Acute respiratory failure with hypoxia ([place])   Dementia   Dehydration with hyponatremia   History of CHF (congestive heart failure)   Acute hyperkalemia   CKD (chronic kidney disease), stage II   Abnormal EKG   Elevated lactic acid level   Chronic atrial fibrillation (HCC)   HPI: This is a 82 year old female patient recently moved to the [place] area about 2 months ago from [place] who has a past medical history of COPD and recurrent chronic bronchitis/bronchiectasis, remote hemoptysis, CHF not specified, chronic atrial fibrillation not on anticoagulation, hypothyroidism, dementia and stage II chronic kidney disease. History obtained from patient's daughter at bedside due to patient's underlying acute respiratory failure and need for BiPAP. Patient began having respiratory symptoms on Friday with cough of clear productive sputum. She continued have constant coughing. Her daughter came to [facility] independent living to continue to monitor her mother. She reports at that time nursing checked the patient and her oxygen levels, BP and temperature were okay. Sunday she was taken to a local urgent care where an x-ray was done that showed no evidence of pneumonia. She was given a nebulizer treatment and empiric Z-Pak.  Unfortunately by Monday she worsened and began having increasing yellow productive sputum and increasing malaise and poor appetite. She also developed increasing anxiety. During this entire time she had not run any fevers. Because of concerns over dehydration her daughter held her Lasix today but had given her usual heart medications.  Because of nausea her iron was not given. Because she continued to do poorly and was hypoxemic today the daughter requested the patient be evaluated by primary physician Dr. Felipa Eth.  Her  daughter reports that baseline blood pressure readings for this patient are anywhere between 90/60 and 100/70.    Subjective  Blood pressure continues to be soft in the 80s and 90s, complaining of left hip and left groin pain, and atrial fibrillation  Assessment and plan  Acute hypoxemic respiratory failure   /Acute bronchiolitis/ COPD  Continue step down, meets  sepsis  Criteria , qSOFA = 2 X-ray shows right lower lobe pneumonia, lactic acid normal, pro-calcitonin <0.10 Initiated on broad-spectrum antibiotics, vancomycin and Zosyn, day #2 Held BiPAP due to somnolence and increased risk of recurrent aspiration, continue nothing by mouth pending speech therapy consult -History of atrial fibrillation and recent tachycardia so we'll utilize Xopenex for nebs ABG shows pH 7.2 , PCO2 44, P O2 59.0, repeat ABG pending Called PCCM, to see if she would be a candidate for vasopressors , CHF on x ray looks worse , will hold off on further boluses    Hypothermia, hyponatremia, hyperkalemia Suspected adrenal insufficiency, cortisol level okay, Started on Solu-Medrol to help with respiratory symptoms , no history of recent steroid use  Left hip pain, could be lumbar radiculopathy/sciatica- radiating into the mid femur, obtain x-rays, Doppler to rule out DVT,    Acute hyperkalemia, resolved -Secondary to acute dehydration and acute renal failure -Holding preadmission Lasix and ACE inhibitor     Elevated lactic acid level -Secondary to acute renal failure and volume depletion -Lactic acid has improved after hydration and patient has low normal blood pressure readings at baseline and upon review  of vital signs and other laboratory data does not meet criteria for sepsis   Dehydration with hyponatremia -Sodium  has increased with volume resuscitation in the ER -Continue IV fluids at 100 mL per hour -Repeat labs in a.m.   Acute renal failure /CKD, stage II -Baseline creatinine unknown but upon review of records from care everywhere documented with stage II chronic kidney disease -Renal function slightly improved after following repletion -Follow labs -Avoid nephrotoxic medications   Chronic atrial fibrillation  -Family confirms this is chronic diagnoses -Previously followed by cardiologist in [place] -Currently rate controlled -Carvedilol on hold until euvolemic -CHADVASc = 5 and was not on anticoagulation prior to admission  not an anticoagulation per daughter   Hypothyroidism -Continue Synthroid TSH normal   Dementia -Progressive issue for several years, concern for recurrent aspiration for several months SLP eval   Chronic systolic heart failure without exacerbation , [id number] -chronic systolic HF ,no exacerbation,   -Family reports last echo greater than 1 year ago so we'll obtain echocardiogram this admission -Watch closely with volume resuscitation   Abnormal EKG -Does have subtle ST segment changes in lateral leads -No prior EKG for comparison but under care everywhere a previous read-out of an EKG from 2011 reads as completely normal -Repeat EKG in a.m. -Initial troponin within normal limits    DVT prophylaxsis  Lovenox  Code Status:      Code Status Orders        Start     Ordered   01/07/2016 1816  Do not attempt resuscitation (DNR)   Continuous    Question Answer Comment  In the event of cardiac or respiratory ARREST Do not call a "code blue"   In the event of cardiac or respiratory ARREST Do not perform Intubation, CPR, defibrillation or ACLS   In the event of cardiac or respiratory ARREST Use medication by any route, position, wound care, and other measures to relive pain and suffering. May use oxygen, suction and manual treatment of airway obstruction as  needed for comfort.      12/25/2015 1815    Code Status History    Date Active Date Inactive Code Status Order ID Comments User Context   01/12/2016  4:34 PM 12/22/2015  6:15 PM DNR [id number]  Marilyn Parr, NP ED   01/19/2016  4:18 PM 12/30/2015  4:34 PM Full Code [id number]  Marilyn Parr, NP ED      Family Communication: Discussed in detail with the patient/2 daughters, all imaging results, lab results explained to the patient   Disposition Plan:  Palliative care consultation ordered for family support , very anxious daughters     Consultants:  Palliative care    Procedures:  None  Antibiotics: Anti-infectives    Start     Dose/Rate Route Frequency Ordered Stop   12/31/15 2100  levofloxacin (LEVAQUIN) IVPB 500 mg  Status:  Discontinued     500 mg 100 mL/hr over 60 Minutes Intravenous Every 48 hours 01/02/2016 1821 12/30/15 0730   12/30/15 1400  vancomycin (VANCOCIN) 500 mg in sodium chloride 0.9 % 100 mL IVPB  Status:  Discontinued     500 mg 100 mL/hr over 60 Minutes Intravenous Every 24 hours 12/28/2015 1309 12/28/2015 1815   12/30/15 1300  vancomycin (VANCOCIN) 500 mg in sodium chloride 0.9 % 100 mL IVPB     500 mg 100 mL/hr over 60 Minutes Intravenous Every 24 hours 12/30/15 0757     12/30/15 0800  piperacillin-tazobactam (ZOSYN) IVPB 3.375  g     3.375 g 12.5 mL/hr over 240 Minutes Intravenous Every 8 hours 12/30/15 0757     01/14/2016 2100  levofloxacin (LEVAQUIN) IVPB 750 mg     750 mg 100 mL/hr over 90 Minutes Intravenous  Once 12/22/2015 1821 12/28/2015 2250   12/31/2015 2000  piperacillin-tazobactam (ZOSYN) IVPB 3.375 g  Status:  Discontinued     3.375 g 12.5 mL/hr over 240 Minutes Intravenous Every 8 hours 01/08/2016 1309 12/26/2015 1815   12/20/2015 1830  levofloxacin (LEVAQUIN) IVPB 750 mg  Status:  Discontinued     750 mg 100 mL/hr over 90 Minutes Intravenous Every 24 hours 12/20/2015 1815  1821   01/05/2016 1315  piperacillin-tazobactam (ZOSYN) IVPB 3.375 g     3.375  g 100 mL/hr over 30 Minutes Intravenous  Once 01/14/2016 1303 01/17/2016 1412   01/15/2016 1315  vancomycin (VANCOCIN) IVPB 1000 mg/200 mL premix     1,000 mg 200 mL/hr over 60 Minutes Intravenous  Once 12/24/2015 1303 01/09/2016 1435          Objective: Filed Vitals:   12/31/15 0600 12/31/15 0700 12/31/15 0800 12/31/15 0901  BP: 87/67 81/66 86/70    Pulse: 113 86 52   Temp:      TempSrc:      Resp: 14 15 15    Height:      Weight:      SpO2: 98% 96% 96% 100%   No intake or output data in the 24 hours ending 12/31/15 1000  Exam:  General: In moderate acute respiratory distress that has improved with application of BiPAP, patient appears frail and malnourished  Psych: Anxious and confused affect, oriented to name only; able to follow simple commands easily  Neuro: No focal neurological deficits, CN II through XII intact, Strength 4/5 all 4 extremities, Sensation intact all 4 extremities.  ENT: Ears and Eyes appear Normal, Conjunctivae clear, PER. Dry oral mucosa without erythema or exudates.  Neck: Supple, No lymphadenopathy appreciated  Respiratory: Symmetrical chest wall movement, diminished air movement bilaterally though improved after application of BiPAP, coarse to auscultation bilaterally   Cardiac: Irregular with underlying atrial fibrillation, No Murmurs, no LE edema noted, no JVD, No carotid bruits, peripheral pulses palpable at 2+  Abdomen: Positive bowel sounds, Soft, Non tender, Non distended, No masses appreciated, no obvious hepatosplenomegaly  Data Review   Micro Results Recent Results (from the past 240 hour(s))  Blood Culture (routine x 2)     Status: None (Preliminary result)   Collection Time: 01/02/2016  1:00 PM  Result Value Ref Range Status   Specimen Description BLOOD BLOOD RIGHT FOREARM  Final   Special Requests BOTTLES DRAWN AEROBIC AND ANAEROBIC 5CC  Final   Culture NO GROWTH < 24 HOURS  Final   Report Status PENDING  Incomplete  Blood Culture  (routine x 2)     Status: None (Preliminary result)   Collection Time: 01/07/2016  1:41 PM  Result Value Ref Range Status   Specimen Description BLOOD RIGHT ANTECUBITAL  Final   Special Requests IN PEDIATRIC BOTTLE [place]  Final   Culture NO GROWTH < 24 HOURS  Final   Report Status PENDING  Incomplete  Urine culture     Status: None   Collection Time: 12/20/2015  4:45 PM  Result Value Ref Range Status   Specimen Description URINE, CATHETERIZED  Final   Special Requests NONE  Final   Culture NO GROWTH 1 DAY  Final   Report Status 12/30/2015 FINAL  Final  Culture, respiratory (NON-Expectorated)     Status: None (Preliminary result)   Collection Time: 01/11/2016  8:00 PM  Result Value Ref Range Status   Specimen Description TRACHEAL ASPIRATE  Final   Special Requests NONE  Final   Gram Stain   Final    FEW WBC PRESENT, PREDOMINANTLY MONONUCLEAR ABUNDANT SQUAMOUS EPITHELIAL CELLS PRESENT MODERATE GRAM POSITIVE RODS MODERATE GRAM NEGATIVE RODS FEW GRAM POSITIVE COCCI    Culture   Final    Culture reincubated for better growth Performed at [facility]    Report Status PENDING  Incomplete  MRSA PCR Screening     Status: None   Collection Time: 12/30/15  5:33 PM  Result Value Ref Range Status   MRSA by PCR NEGATIVE NEGATIVE Final    Comment:        The GeneXpert MRSA Assay (FDA approved for NASAL specimens only), is one component of a comprehensive MRSA colonization surveillance program. It is not intended to diagnose MRSA infection nor to guide or monitor treatment for MRSA infections.     Radiology Reports Dg Chest Port 1 View  12/30/2015  CLINICAL DATA:  Acute respiratory failure.  Sepsis. EXAM: PORTABLE CHEST 1 VIEW COMPARISON:  Single view of the chest 01/09/2016. FINDINGS: There is new airspace disease in the right lung base. The left lung is clear. Heart size is enlarged. No pneumothorax or pleural effusion. IMPRESSION: New right basilar airspace disease could be due  to atelectasis or pneumonia. Electronically Signed   By: Inge Rise M.D.   On: 12/30/2015 08:38   Dg Chest Port 1 View  01/15/2016  CLINICAL DATA:  Sepsis. EXAM: PORTABLE CHEST 1 VIEW COMPARISON:  None. FINDINGS: There is hyperinflation and interstitial coarsening correlating with patient's history of COPD. Cardiomegaly and aortic tortuosity without suspected edema. No effusion or pneumothorax. No focal consolidation. IMPRESSION: 1. COPD findings without definite superimposed pneumonia. 2. Cardiomegaly. Electronically Signed   By: Monte Fantasia M.D.   On: 01/18/2016 13:55     CBC  Recent Labs Lab 01/17/2016 1222  WBC 7.4  HGB 12.3  HCT 38.6  PLT 155  MCV 90.4  MCH 28.8  MCHC 31.9  RDW 18.9*  LYMPHSABS 0.5*  MONOABS 0.4  EOSABS 0.0  BASOSABS 0.0    Chemistries   Recent Labs Lab 01/16/2016 1222 01/16/2016 1607 12/27/2015 1612  NA 128*  --  130*  K 5.3*  --  5.1  CL 96*  --  99*  CO2 21*  --  22  GLUCOSE 89  --  103*  BUN 49*  --  47*  CREATININE 1.18*  --  1.14*  CALCIUM 8.9  --  8.4*  AST  --  47*  --   ALT  --  128*  --   ALKPHOS  --  75  --   BILITOT  --  0.9  --    ------------------------------------------------------------------------------------------------------------------ estimated creatinine clearance is 28.3 mL/min (by C-G formula based on Cr of 1.14). ------------------------------------------------------------------------------------------------------------------ No results for input(s): HGBA1C in the last 72 hours. ------------------------------------------------------------------------------------------------------------------ No results for input(s): CHOL, HDL, LDLCALC, TRIG, CHOLHDL, LDLDIRECT in the last 72 hours. ------------------------------------------------------------------------------------------------------------------  Recent Labs  12/30/15 0544  TSH 2.697    ------------------------------------------------------------------------------------------------------------------ No results for input(s): VITAMINB12, FOLATE, FERRITIN, TIBC, IRON, RETICCTPCT in the last 72 hours.  Coagulation profile No results for input(s): INR, PROTIME in the last 168 hours.  No results for input(s): DDIMER in the last 72 hours.  Cardiac Enzymes  Recent Labs Lab  12/30/15 1155  TROPONINI 0.05*   ------------------------------------------------------------------------------------------------------------------ Invalid input(s): POCBNP   CBG: No results for input(s): GLUCAP in the last 168 hours.     Studies: Dg Chest Port 1 View  12/30/2015  CLINICAL DATA:  Acute respiratory failure.  Sepsis. EXAM: PORTABLE CHEST 1 VIEW COMPARISON:  Single view of the chest 12/31/2015. FINDINGS: There is new airspace disease in the right lung base. The left lung is clear. Heart size is enlarged. No pneumothorax or pleural effusion. IMPRESSION: New right basilar airspace disease could be due to atelectasis or pneumonia. Electronically Signed   By: Inge Rise M.D.   On: 12/30/2015 08:38   Dg Chest Port 1 View  12/23/2015  CLINICAL DATA:  Sepsis. EXAM: PORTABLE CHEST 1 VIEW COMPARISON:  None. FINDINGS: There is hyperinflation and interstitial coarsening correlating with patient's history of COPD. Cardiomegaly and aortic tortuosity without suspected edema. No effusion or pneumothorax. No focal consolidation. IMPRESSION: 1. COPD findings without definite superimposed pneumonia. 2. Cardiomegaly. Electronically Signed   By: Monte Fantasia M.D.   On: 12/31/2015 13:55      No results found for: HGBA1C Lab Results  Component Value Date   CREATININE 1.14* 12/21/2015       Scheduled Meds: . cycloSPORINE  1 drop Both Eyes BID  . enoxaparin (LOVENOX) injection  30 mg Subcutaneous Daily  . levalbuterol  1.25 mg Nebulization 4 times per day  . levothyroxine  25 mcg Oral QAC  breakfast  . methylPREDNISolone (SOLU-MEDROL) injection  60 mg Intravenous Q12H  . piperacillin-tazobactam (ZOSYN)  IV  3.375 g Intravenous Q8H  . sodium chloride  250 mL Intravenous Once  . vancomycin  500 mg Intravenous Q24H   Continuous Infusions: . sodium chloride 50 mL/hr at 12/31/15 0003    Principal Problem:   HCAP (healthcare-associated pneumonia) Active Problems:   Acute bronchiolitis   Hypothyroidism   COPD (chronic obstructive pulmonary disease) (HCC)   Acute respiratory failure with hypoxia (HCC)   Dementia   Dehydration with hyponatremia   History of CHF (congestive heart failure)   Acute hyperkalemia   CKD (chronic kidney disease), stage II   Abnormal EKG   Elevated lactic acid level   Chronic atrial fibrillation ([place])    Time spent: 45 minutes   [facility] Pager [phone]. If 7PM-7AM, please contact night-coverage at [email or web address], password [facility] 12/31/2015, 10:00 AM  LOS: 2 days

## 2015-12-31 NOTE — Progress Notes (Signed)
Pt BP 86/66, tep 96.5 rectal and 02 stats 85 on 6L [place], pt is alert and responsive.   Debbie from rapid response notified to come and assess pt, Critical Care also on the way to assess pt.  Pt put on non re breather with increase in sats to 92%.  Will continue to closely monitor.

## 2015-12-31 NOTE — Evaluation (Signed)
Clinical/Bedside Swallow Evaluation Patient Details  Name: Marilyn Henderson MRN: [id number] Date of Birth: 02/14/1923  Today's Date: 12/31/2015 Time: SLP Start Time (ACUTE ONLY): 1110 SLP Stop Time (ACUTE ONLY): 1122 SLP Time Calculation (min) (ACUTE ONLY): 12 min  Past Medical History:  Past Medical History  Diagnosis Date  . CHF (congestive heart failure) ([place])   . COPD (chronic obstructive pulmonary disease) ([place])   . Pneumonia 12/2015  . AF (atrial fibrillation) ([place])   . Chronic kidney disease   . Hypothyroidism   . Dementia   . Cancer [facility])     history of oral cancer   Past Surgical History:  Past Surgical History  Procedure Laterality Date  . Colon surgery    . Cataract extraction     HPI:  Marilyn Henderson is a 82 y.o. female with a Past Medical History of dementia, CHF, COPD who presents with acute respiratory distress. Acute respiratory failure with hypoxia /Acute bronchiolitis/ COPD. CXR shows right basilar airspace disease could be due to atelectasis or   Assessment / Plan / Recommendation Clinical Impression  Pt demonstrates signs of dysphagia concerning for high risk of aspiration. Pts oral cavity is dry and swallow response appears delayed with decreased bolus formation and active transit. Pt has a behavior after swallowing as if she wants to cough, but does not. Concerned for reduced laryngeal sensation with dry mucosa and possibly irritated larynx due to frequent hard coughing. Though pt did not show overt signs of aspiration, recommend a conservaitive texture to reduce risk. Nectar thick liquids recommended; pt refused soldi and daugther reports she will not eat solids until she feels better. Will order full, nectar thick liquids and f/u for tolerance.     Aspiration Risk  Moderate aspiration risk    Diet Recommendation Nectar-thick liquid   Liquid Administration via: Cup;Straw Medication Administration: Whole meds with puree Supervision: Staff to assist with self  feeding Compensations: Slow rate;Small sips/bites Postural Changes: Seated upright at 90 degrees    Other  Recommendations Oral Care Recommendations: Oral care BID Other Recommendations: Order thickener from pharmacy   Follow up Recommendations  24 hour supervision/assistance    Frequency and Duration min 2x/week  2 weeks       Prognosis Prognosis for Safe Diet Advancement: Good      Swallow Study   General HPI: [name] is a 82 y.o. female with a Past Medical History of dementia, CHF, COPD who presents with acute respiratory distress. Acute respiratory failure with hypoxia /Acute bronchiolitis/ COPD. CXR shows right basilar airspace disease could be due to atelectasis or Type of Study: Bedside Swallow Evaluation Previous Swallow Assessment: none Diet Prior to this Study: Regular;Thin liquids Temperature Spikes Noted: No Respiratory Status: Nasal cannula History of Recent Intubation: No Behavior/Cognition: Alert;Requires cueing Oral Cavity Assessment: Dry Oral Care Completed by SLP: No Oral Cavity - Dentition: Missing dentition;Dentures, top Vision: Functional for self-feeding Self-Feeding Abilities: Needs assist Patient Positioning: Upright in bed Baseline Vocal Quality: Breathy;Hoarse Volitional Cough: Congested;Weak Volitional Swallow: Able to elicit    Oral/Motor/Sensory Function Overall Oral Motor/Sensory Function: Generalized oral weakness   Ice Chips     Thin Liquid Thin Liquid: Impaired Presentation: Cup;Straw;Self Fed Pharyngeal  Phase Impairments: Suspected delayed Swallow;Throat Clearing - Immediate    Nectar Thick Nectar Thick Liquid: Impaired Pharyngeal Phase Impairments: Suspected delayed Swallow   Honey Thick Honey Thick Liquid: Not tested   Puree Puree: Within functional limits   Solid   GO   Solid:  (pt refused)  Herbie [place], [place] CCC-SLP [phone]  Lynann Beaver 12/31/2015,1:44 PM

## 2015-12-31 NOTE — Progress Notes (Signed)
Called to assess patient with low BP - asked staff to check rectal temp - on my arrival patient in mild distress from [facility] down with checking of temp - alert - nods to questions no real conversation - follows commands - pale - cool and dry - dry oral mucosa - wet weak cough - O2 sats down- placed on NRB mask - BP 89/62 HR 117 RR 22 - denies pain - but restless -seems scared - granddaughter at bedside - daughters on way back to hospital - R. Desi NP here - no acute interventions at this point - labs drawn and 12 lead done - handoff to [employer] - will follow as needed.

## 2015-12-31 NOTE — Progress Notes (Signed)
Unable to obtain ABG at this time.  MD aware.

## 2015-12-31 NOTE — Progress Notes (Signed)
Pt BP 76/63 after second 250 mL NS bolus.  Dr. Allyson Sabal notified and she stated she called critical care to come and assess pt.  Will continue to closely monitor.

## 2015-12-31 NOTE — Progress Notes (Signed)
Pt BP 84/70.  Dr. Allyson Sabal notified and orders received to administer a 250 mL NS bolus.  Will continue to closely monitor.

## 2015-12-31 NOTE — Clinical Social Work Note (Signed)
Clinical Social Work Assessment  Patient Details  Name: Marilyn Henderson MRN: [id number] Date of Birth: 1923-07-20  Date of referral:  12/31/15               Reason for consult:  Facility Placement                Permission sought to share information with:  Family Supports Permission granted to share information::  No (patient not fully oriented at this time)  Name::      (two daughters: )  Agency::     Relationship::     Contact Information:     Housing/Transportation Living arrangements for the past 2 months:  [place] ([facility]) Source of Information:  Adult Children (two daughters and daughter's cousins) Patient Interpreter Needed:  None Criminal Activity/Legal Involvement Pertinent to Current Situation/Hospitalization:  No - Comment as needed Significant Relationships:  Adult Children Lives with:    Do you feel safe going back to the place where you live?    Need for family participation in patient care:  No (Coment)  Care giving concerns:  Daughters are unsure what level of care patient will likely be recommended for at time of discharge.  Unsure if patient is able to return to IND LIV level of living at time of discharge.  Agreeable to SNF though not decided this is the exact route they will presume.   Social Worker assessment / plan:  CSW met with family in conference room of unit.  Family had a conference with Palliative care to consider goals of care.  Family encouraged after that meeting, but it opened doors for questions for CSW.  Patient has not yet been evaluated by PT, though family anticipates patient will not be able to return to IND LIV level of care.  Daughters feel that patient may best be suited for SNF at time of discharge and has a few facilities they would like to check into.  They have no priority order thought they are entertaining: [facility], [facility], [facility] and [facility].  Unit CSW to follow up regarding facilities  once bed offers are in.  Patient is from [place].  Daughters are engaged and involved in patient care taking notes during this meeting and agreeable to tour facilities for patient.  Daughters can be contacted via phone if not available at bedside.  Daughter communicating most during meeting/taking notes is Marilyn Henderson.  Employment status:  Retired Forensic scientist:  Information systems manager, [place] Benefit PT Recommendations:  Not assessed at this time Information / Referral to community resources:  [place]  Patient/Family's Response to care:  Family is agreeable to SNF though entertaining other dispositions as this time.  PT to eval and provide recommendations.  Patient/Family's Understanding of and Emotional Response to Diagnosis, Current Treatment, and Prognosis:  Patient's family is realistic regarding patient's prognosis and quality of life.  Patient's family very active in patient care and patient's best interests.  Emotional Assessment Appearance:  Appears older than stated age Attitude/Demeanor/Rapport:    Affect (typically observed):    Orientation:  Oriented to Self Alcohol / Substance use:  Not Applicable Psych involvement (Current and /or in the community):  No (Comment)  Discharge Needs  Concerns to be addressed:  No discharge needs identified Readmission within the last 30 days:  No Current discharge risk:  None Barriers to Discharge:  Continued Medical Work up   Marilyn Fanny, LCSW 12/31/2015, 3:53 PM

## 2015-12-31 NOTE — Consult Note (Signed)
Consultation Note Date: 12/31/2015   Patient Name: Marilyn Henderson  DOB: 13-Sep-1923  MRN: [id number]  Age / Sex: 82 y.o., female  PCP: Lottie Dawson, MD Referring Physician: Reyne Dumas, MD  Reason for Consultation: Establishing goals of care    Clinical Assessment/Narrative: Patient is a 82 year old lady originally from [place], [place]. She has since moved to the [place] area since May 2016. Patient has history of congestive heart failure and underlying chronic obstructive pulmonary disease. Patient has had issues with dehydration and hyponatremia.  Patient's 2 daughters help guide her care in decision-making.   it is reported that the patient has had gradual dyspnea on exertion for the past couple of months. Patient lives in an independent living facility [facility]. Patient's daughters state that the patient was still able to be functional to some extent and that they have not noticed a significant decline in her condition overall in the past few-several months. She has not had any hospitalizations since she moved to [place]. They question whether she has some dementia and does at times exhibited cognitive difficulties. The patient has had significant cough and hoarseness and was diagnosed with chronic bronchitis in the outpatient setting. It is reported that she failed Z-Pak treatment. She was admitted with sepsis type picture and pneumonia, COPD exacerbation. Chest x-ray shows right lower lobe infiltrate. She has been seen and evaluated by speech and swallow therapy who state that the patient is at moderate aspiration risk and is recommended for her to have nectar thick liquids. Daughter states she has always had low blood pressures all her life. Patient is also noted to be complaining of left hip discomfort. There is no history of fall no history of compression fractures. Plain film ordered  does not show any acute evidence of fracture.  Introduced scope of palliative medicine. Discussed about hospice services in detail as well. All questions answered. For now, the daughter's wish to continue current mode of care. They hope that the patient can stabilize enough to participate in physical therapy and eventually be discharged to skilled nursing facility for rehabilitation towards the end of this hospitalization. Discussed echocardiogram results that the patient has severe systolic congestive heart failure. Discussed about the symptom burden from multiple chronic illnesses such as congestive heart failure, COPD, risk of recurrent aspiration. Discussed that at some point, goals will need to be rediscussed again and it may be prudent at that time to consider a more gentle/comfort based pathway to her care that may include addition of hospice as an extra layer of support.  All questions answered, palliative will continue to follow   Contacts/Participants in Discussion: Primary Decision Maker:    Relationship to Patient  HCPOA: yes  2 daughters make decisions together  SUMMARY OF RECOMMENDATIONS: social work/case management consult: Family wishes to proceed with skilled nursing facility for rehabilitation. Physical therapy consult.  Continue current scope of treatment with antibiotics and other medical interventions. Palliative will follow hospital course and help guide appropriate decision-making as far as disposition options. Agree with DO NOT RESUSCITATE.   Code Status/Advance Care Planning: DNR    Code Status Orders        Start     Ordered   01/03/2016 1816  Do not attempt resuscitation (DNR)   Continuous    Question Answer Comment  In the event of cardiac or respiratory ARREST Do not call a "code blue"   In the event of cardiac or respiratory ARREST Do not perform Intubation, CPR, defibrillation or ACLS  In the event of cardiac or respiratory ARREST Use medication by any  route, position, wound care, and other measures to relive pain and suffering. May use oxygen, suction and manual treatment of airway obstruction as needed for comfort.      01/08/2016 1815    Code Status History    Date Active Date Inactive Code Status Order ID Comments User Context   01/07/2016  4:34 PM 01/15/2016  6:15 PM DNR [id number]  Samella Parr, NP ED   12/30/2015  4:18 PM 01/05/2016  4:34 PM Full Code [id number]  Samella Parr, NP ED      Other Directives:Other  Symptom Management:   As above  Palliative Prophylaxis:   Delirium Protocol  Additional Recommendations (Limitations, Scope, Preferences):  Continue current treatment    Psycho-social/Spiritual:  Support System: [place] for further Chaplaincy support:no Additional Recommendations: Caregiving  Support/Resources  Prognosis: < 3 months  Discharge Planning: Pending hospital course   Chief Complaint/ Primary Diagnoses: Present on Admission:  . Acute bronchiolitis . Hypothyroidism . COPD (chronic obstructive pulmonary disease) ([place]) . Acute respiratory failure with hypoxia ([place]) . Dementia . Dehydration with hyponatremia . Acute hyperkalemia . CKD (chronic kidney disease), stage II . Abnormal EKG . Elevated lactic acid level . Chronic atrial fibrillation ([facility])  I have reviewed the medical record, interviewed the patient and family, and examined the patient. The following aspects are pertinent.  Past Medical History  Diagnosis Date  . CHF (congestive heart failure) ([place])   . COPD (chronic obstructive pulmonary disease) ([place])   . Pneumonia 12/2015  . AF (atrial fibrillation) ([place])   . Chronic kidney disease   . Hypothyroidism   . Dementia   . Cancer [facility])     history of oral cancer   Social History   Social History  . Marital Status: Widowed    Spouse Name: N/A  . Number of Children: N/A  . Years of Education: N/A   Social History Main Topics  . Smoking status: Former Research scientist (life sciences)  .  Smokeless tobacco: Never Used     Comment: quit smoking around 1960's   . Alcohol Use: No  . Drug Use: No  . Sexual Activity: Not Asked   Other Topics Concern  . None   Social History Narrative   Family History  Problem Relation Age of Onset  . Heart disease Father   . COPD Sister   . Heart disease Sister    Scheduled Meds: . cycloSPORINE  1 drop Both Eyes BID  . enoxaparin (LOVENOX) injection  30 mg Subcutaneous Daily  . levalbuterol  1.25 mg Nebulization 4 times per day  . levothyroxine  25 mcg Oral QAC breakfast  . lidocaine  1 patch Transdermal Q24H  . methylPREDNISolone (SOLU-MEDROL) injection  60 mg Intravenous Q12H  . piperacillin-tazobactam (ZOSYN)  IV  3.375 g Intravenous Q8H  . vancomycin  500 mg Intravenous Q24H   Continuous Infusions: . sodium chloride 50 mL/hr at 12/31/15 0003   PRN Meds:.ALPRAZolam, RESOURCE THICKENUP CLEAR, traMADol Medications Prior to Admission:  Prior to Admission medications   Medication Sig Start Date End Date Taking? Authorizing Provider  albuterol (PROAIR HFA) 108 (90 Base) MCG/ACT inhaler Inhale 2 puffs into the lungs every 6 (six) hours as needed for wheezing or shortness of breath.  08/12/15  Yes Historical Provider, MD  ALPRAZolam Duanne Moron) 0.5 MG tablet Take 0.25 mg by mouth at bedtime as needed for anxiety or sleep.    Yes Historical Provider, MD  benzonatate (  TESSALON) 100 MG capsule Take 100 mg by mouth 2 (two) times daily as needed for cough.  12/27/15  Yes Historical Provider, MD  Calcium-Vitamin D-Vitamin K (VIACTIV) [id number] MG-UNT-MCG CHEW Chew 1 tablet by mouth daily.   Yes Historical Provider, MD  carvedilol (COREG) 6.25 MG tablet Take 6.25 mg by mouth 2 (two) times daily. 10/22/15  Yes Historical Provider, MD  cycloSPORINE (RESTASIS) 0.05 % ophthalmic emulsion Place 1 drop into both eyes 2 (two) times daily. 03/28/14  Yes Historical Provider, MD  ferrous sulfate 325 (65 FE) MG tablet Take 325 mg by mouth daily.   Yes Historical  Provider, MD  furosemide (LASIX) 20 MG tablet Take 20 mg by mouth every other day. 11/06/15  Yes Historical Provider, MD  furosemide (LASIX) 40 MG tablet Take 40 mg by mouth every other day. 11/06/15  Yes Historical Provider, MD  guaiFENesin (MUCINEX) 600 MG 12 hr tablet Take 600 mg by mouth 2 (two) times daily as needed for cough or to loosen phlegm.  12/25/09  Yes Historical Provider, MD  lisinopril (PRINIVIL,ZESTRIL) 2.5 MG tablet Take 2.5 mg by mouth daily. 10/18/15  Yes Historical Provider, MD  Multiple Vitamin (MULTIVITAMIN WITH MINERALS) TABS tablet Take 1 tablet by mouth daily.   Yes Historical Provider, MD  SYNTHROID 25 MCG tablet Take 25 mcg by mouth daily. 11/05/15  Yes Historical Provider, MD   Allergies  Allergen Reactions  . Codeine   . Paxil [Paroxetine Hcl]     Altered mental status    Review of Systems Positive for anxiety questionable underlying dementia, positive for dyspnea on exertion for the past 2 months  Physical Exam elderly lady weak thin Mild distress complaining of pain in hip Diminished shallow breathing scattered wheezes S1-S2 Trace edema Is able to answer a few simple questions appropriately  Vital Signs: BP 79/63 mmHg  Pulse 87  Temp(Src) 97.4 F (36.3 C) (Axillary)  Resp 17  Ht 5\' 4"  (1.626 m)  Wt 55 kg (121 lb 4.1 oz)  BMI 20.80 kg/m2  SpO2 84%  SpO2: SpO2: (!) 84 % O2 Device:SpO2: (!) 84 % O2 Flow Rate: .O2 Flow Rate (L/min): 6 L/min  IO: Intake/output summary:  Intake/Output Summary (Last 24 hours) at 12/31/15 1656 Last data filed at 12/31/15 0900  Gross per 24 hour  Intake      0 ml  Output      0 ml  Net      0 ml    LBM:   Baseline Weight: Weight: 54.432 kg (120 lb) Most recent weight: Weight: 55 kg (121 lb 4.1 oz)      Palliative Assessment/Data:  Flowsheet Rows        Most Recent Value   Intake Tab    Referral Department  Hospitalist   Unit at Time of Referral  Med/Surg Unit   Palliative Care Primary Diagnosis  Pulmonary    Date Notified  12/30/15   Palliative Care Type  New Palliative care   Reason for referral  Clarify Goals of Care   Date of Admission  01/12/2016   Date first seen by Palliative Care  12/31/15   # of days IP prior to Palliative referral  1   Clinical Assessment    Palliative Performance Scale Score  30%   Pain Max last 24 hours  5   Pain Min Last 24 hours  4   Dyspnea Max Last 24 Hours  4   Dyspnea Min Last 24 hours  3  Psychosocial & Spiritual Assessment    Palliative Care Outcomes    Patient/Family meeting held?  Yes   Who was at the meeting?  2 daughters, 2 nieces   Palliative Care Outcomes  Clarified goals of care, Counseled regarding hospice   Palliative Care follow-up planned  Yes, Facility      Additional Data Reviewed:  CBC:    Component Value Date/Time   WBC 7.4 12/31/2015 1222   HGB 12.3 12/28/2015 1222   HCT 38.6 12/23/2015 1222   PLT 155 01/06/2016 1222   MCV 90.4 12/30/2015 1222   NEUTROABS 6.5 01/03/2016 1222   LYMPHSABS 0.5* 01/02/2016 1222   MONOABS 0.4 12/31/2015 1222   EOSABS 0.0 01/08/2016 1222   BASOSABS 0.0 01/10/2016 1222   Comprehensive Metabolic Panel:    Component Value Date/Time   NA 130* 01/13/2016 1612   K 5.1 01/17/2016 1612   CL 99* 12/24/2015 1612   CO2 22 01/08/2016 1612   BUN 47* 01/06/2016 1612   CREATININE 1.14* 01/16/2016 1612   GLUCOSE 103* 01/18/2016 1612   CALCIUM 8.4* 12/31/2015 1612   AST 47* 01/15/2016 1607   ALT 128* 01/17/2016 1607   ALKPHOS 75 01/13/2016 1607   BILITOT 0.9 12/20/2015 1607   PROT 6.6 01/02/2016 1607   ALBUMIN 3.5 12/23/2015 1607     Time In: 1300 Time Out: 1400 Time Total: 60 Greater than 50%  of this time was spent counseling and coordinating care related to the above assessment and plan.  Signed by: Loistine Chance, MD [phone] Loistine Chance, MD  12/31/2015, 4:56 PM  Please contact Palliative Medicine Team phone at [phone] for questions and concerns.

## 2015-12-31 NOTE — Progress Notes (Signed)
Pt BP 73/63 after one 250 mL NS bolus.  Dr. Allyson Sabal notified and orders received to obtain chest xray and to administer another 250 mL bolus.  Will continue to closely monitor.

## 2016-01-01 LAB — RESPIRATORY VIRUS PANEL
Adenovirus: NEGATIVE
Influenza A: NEGATIVE
Influenza B: NEGATIVE
Metapneumovirus: NEGATIVE
Parainfluenza 1: NEGATIVE
Parainfluenza 2: NEGATIVE
Parainfluenza 3: NEGATIVE
Respiratory Syncytial Virus A: NEGATIVE
Respiratory Syncytial Virus B: POSITIVE — AB
Rhinovirus: NEGATIVE

## 2016-01-02 LAB — CULTURE, RESPIRATORY W GRAM STAIN

## 2016-01-02 LAB — CULTURE, RESPIRATORY

## 2016-01-03 LAB — CULTURE, BLOOD (ROUTINE X 2)
Culture: NO GROWTH
Culture: NO GROWTH

## 2016-01-20 NOTE — Progress Notes (Signed)
Received text page alerts regarding pt around 0600. Went to pt bedside, charge nurse Gerald Stabs was already in pt room. Pt started to brady down and 02 sats started dropping. Pt passed away at 0620 with family at attendance. Monitor turned off and IV fluids were stopped. Family still with pt. Will continue to check in.  Albertina Senegal E

## 2016-01-20 NOTE — Discharge Summary (Addendum)
Physician Discharge Summary  Marilyn Henderson MRN: [id number] DOB/AGE: 1923-08-11 82 y.o.  PCP: Lottie Dawson, MD   Admit date: 01/10/2016 Discharge date: 2016-01-25  Discharge Diagnoses:     Principal Problem:   HCAP (healthcare-associated pneumonia) Active Problems:   Acute bronchiolitis   Hypothyroidism   COPD (chronic obstructive pulmonary disease) ([place])   Acute respiratory failure with hypoxia ([place])   Dementia   Dehydration with hyponatremia   History of CHF (congestive heart failure)   Acute hyperkalemia   CKD (chronic kidney disease), stage II   Abnormal EKG   Elevated lactic acid level   Chronic atrial fibrillation ([place])   Encounter for palliative care   Goals of care, counseling/discussion      Pt passed away at 0620 with family at attendance     Discharge Condition: *   Discharge Instructions     Allergies  Allergen Reactions  . Codeine   . Paxil [Paroxetine Hcl]     Altered mental status      Disposition: Final discharge disposition not confirmed   Consults: * Palliative care PCCM    Significant Diagnostic Studies:  Dg Chest Port 1 View  12/31/2015  CLINICAL DATA:  Shortness of breath EXAM: PORTABLE CHEST 1 VIEW COMPARISON:  12/30/2015 FINDINGS: Cardiomegaly. Diffuse interstitial coarsening which is increased from prior, with new cephalized blood flow. Small pleural effusions. Vascular pedicle widening with stable aortic contour. IMPRESSION: CHF pattern which has progressed since yesterday. Electronically Signed   By: Monte Fantasia M.D.   On: 12/31/2015 16:49   Dg Chest Port 1 View  12/30/2015  CLINICAL DATA:  Acute respiratory failure.  Sepsis. EXAM: PORTABLE CHEST 1 VIEW COMPARISON:  Single view of the chest 01/08/2016. FINDINGS: There is new airspace disease in the right lung base. The left lung is clear. Heart size is enlarged. No pneumothorax or pleural effusion. IMPRESSION: New right basilar airspace disease could be  due to atelectasis or pneumonia. Electronically Signed   By: Inge Rise M.D.   On: 12/30/2015 08:38   Dg Chest Port 1 View  01/10/2016  CLINICAL DATA:  Sepsis. EXAM: PORTABLE CHEST 1 VIEW COMPARISON:  None. FINDINGS: There is hyperinflation and interstitial coarsening correlating with patient's history of COPD. Cardiomegaly and aortic tortuosity without suspected edema. No effusion or pneumothorax. No focal consolidation. IMPRESSION: 1. COPD findings without definite superimposed pneumonia. 2. Cardiomegaly. Electronically Signed   By: Monte Fantasia M.D.   On: 01/18/2016 13:55   Dg Hip Unilat With Pelvis 2-3 Views Left  12/31/2015  CLINICAL DATA:  Left hip pain for 2 days EXAM: DG HIP (WITH OR WITHOUT PELVIS) 2-3V LEFT COMPARISON:  None FINDINGS: There is no evidence of hip fracture or dislocation. There is no evidence of arthropathy or other focal bone abnormality. Scoliosis noted within the lumbar spine. IMPRESSION: 1. No acute findings. 2. If there is high clinical suspicion for occult fracture or the patient refuses to weightbear, consider further evaluation with MRI. Although CT is expeditious, evidence is lacking regarding accuracy of CT over plain film radiography. Electronically Signed   By: Kerby Moors M.D.   On: 12/31/2015 15:40     2-D echo LV EF: 25% -  30%  ------------------------------------------------------------------- Indications:   Dyspnea 786.09.  ------------------------------------------------------------------- History:  PMH:  Altered mental status. Atrial fibrillation. Congestive heart failure. Chronic obstructive pulmonary disease.  ------------------------------------------------------------------- Study Conclusions  - Left ventricle: The cavity size was moderately dilated. Wall thickness was increased in a pattern of mild LVH. Systolic function  was severely reduced. The estimated ejection fraction was in the range of 25% to 30%. Diffuse  hypokinesis. - Aortic valve: There was mild regurgitation. - Mitral valve: There was mild regurgitation. - Left atrium: The atrium was moderately dilated. - Right ventricle: The cavity size was severely dilated. - Right atrium: The atrium was severely dilated. - Atrial septum: No defect or patent foramen ovale was identified. - Tricuspid valve: There was severe regurgitation.   Filed Weights   01/10/2016 1216 12/30/15 1715  Weight: 54.432 kg (120 lb) 55 kg (121 lb 4.1 oz)     Microbiology: Recent Results (from the past 240 hour(s))  Blood Culture (routine x 2)     Status: None (Preliminary result)   Collection Time: 01/07/2016  1:00 PM  Result Value Ref Range Status   Specimen Description BLOOD BLOOD RIGHT FOREARM  Final   Special Requests BOTTLES DRAWN AEROBIC AND ANAEROBIC 5CC  Final   Culture NO GROWTH 2 DAYS  Final   Report Status PENDING  Incomplete  Blood Culture (routine x 2)     Status: None (Preliminary result)   Collection Time: 12/24/2015  1:41 PM  Result Value Ref Range Status   Specimen Description BLOOD RIGHT ANTECUBITAL  Final   Special Requests IN PEDIATRIC BOTTLE 6CC  Final   Culture NO GROWTH 2 DAYS  Final   Report Status PENDING  Incomplete  Urine culture     Status: None   Collection Time: 12/30/2015  4:45 PM  Result Value Ref Range Status   Specimen Description URINE, CATHETERIZED  Final   Special Requests NONE  Final   Culture NO GROWTH 1 DAY  Final   Report Status 12/30/2015 FINAL  Final  Culture, respiratory (NON-Expectorated)     Status: None (Preliminary result)   Collection Time: 01/17/2016  8:00 PM  Result Value Ref Range Status   Specimen Description TRACHEAL ASPIRATE  Final   Special Requests NONE  Final   Gram Stain   Final    FEW WBC PRESENT, PREDOMINANTLY MONONUCLEAR ABUNDANT SQUAMOUS EPITHELIAL CELLS PRESENT MODERATE GRAM POSITIVE RODS MODERATE GRAM NEGATIVE RODS FEW GRAM POSITIVE COCCI    Culture   Final    MODERATE PSEUDOMONAS  AERUGINOSA Performed at [facility]    Report Status PENDING  Incomplete  MRSA PCR Screening     Status: None   Collection Time: 12/30/15  5:33 PM  Result Value Ref Range Status   MRSA by PCR NEGATIVE NEGATIVE Final    Comment:        The GeneXpert MRSA Assay (FDA approved for NASAL specimens only), is one component of a comprehensive MRSA colonization surveillance program. It is not intended to diagnose MRSA infection nor to guide or monitor treatment for MRSA infections.        Blood Culture    Component Value Date/Time   SDES TRACHEAL ASPIRATE 01/02/2016 2000   SPECREQUEST NONE 12/21/2015 2000   CULT  01/10/2016 2000    MODERATE PSEUDOMONAS AERUGINOSA Performed at [facility] PENDING 01/11/2016 2000      Labs: Results for orders placed or performed during the hospital encounter of 12/23/2015 (from the past 48 hour(s))  Cortisol     Status: None   Collection Time: 12/30/15 11:55 AM  Result Value Ref Range   Cortisol, Plasma 71.0 ug/dL    Comment: RESULTS CONFIRMED BY MANUAL DILUTION (NOTE) AM    6.7 - 22.6 ug/dL PM   <10.0  ug/dL   Procalcitonin - Baseline     Status: None   Collection Time: 12/30/15 11:55 AM  Result Value Ref Range   Procalcitonin 0.27 ng/mL    Comment:        Interpretation: PCT (Procalcitonin) <= 0.5 ng/mL: Systemic infection (sepsis) is not likely. Local bacterial infection is possible. (NOTE)         ICU PCT Algorithm               Non ICU PCT Algorithm    ----------------------------     ------------------------------         PCT < 0.25 ng/mL                 PCT < 0.1 ng/mL     Stopping of antibiotics            Stopping of antibiotics       strongly encouraged.               strongly encouraged.    ----------------------------     ------------------------------       PCT level decrease by               PCT < 0.25 ng/mL       >= 80% from peak PCT       OR PCT 0.25 - 0.5 ng/mL           Stopping of antibiotics                                             encouraged.     Stopping of antibiotics           encouraged.    ----------------------------     ------------------------------       PCT level decrease by              PCT >= 0.25 ng/mL       < 80% from peak PCT        AND PCT >= 0.5 ng/mL            Continuin g antibiotics                                              encouraged.       Continuing antibiotics            encouraged.    ----------------------------     ------------------------------     PCT level increase compared          PCT > 0.5 ng/mL         with peak PCT AND          PCT >= 0.5 ng/mL             Escalation of antibiotics                                          strongly encouraged.      Escalation of antibiotics        strongly encouraged.   Troponin I     Status: Abnormal   Collection Time: 12/30/15 11:55 AM  Result Value Ref Range  Troponin I 0.05 (H) <0.031 ng/mL    Comment:        PERSISTENTLY INCREASED TROPONIN VALUES IN THE RANGE OF 0.04-0.49 ng/mL CAN BE SEEN IN:       -UNSTABLE ANGINA       -CONGESTIVE HEART FAILURE       -MYOCARDITIS       -CHEST TRAUMA       -ARRYHTHMIAS       -LATE PRESENTING MYOCARDIAL INFARCTION       -COPD   CLINICAL FOLLOW-UP RECOMMENDED.   MRSA PCR Screening     Status: None   Collection Time: 12/30/15  5:33 PM  Result Value Ref Range   MRSA by PCR NEGATIVE NEGATIVE    Comment:        The GeneXpert MRSA Assay (FDA approved for NASAL specimens only), is one component of a comprehensive MRSA colonization surveillance program. It is not intended to diagnose MRSA infection nor to guide or monitor treatment for MRSA infections.   Procalcitonin     Status: None   Collection Time: 12/31/15  4:50 AM  Result Value Ref Range   Procalcitonin 0.35 ng/mL    Comment:        Interpretation: PCT (Procalcitonin) <= 0.5 ng/mL: Systemic infection (sepsis) is not likely. Local bacterial infection is  possible. (NOTE)         ICU PCT Algorithm               Non ICU PCT Algorithm    ----------------------------     ------------------------------         PCT < 0.25 ng/mL                 PCT < 0.1 ng/mL     Stopping of antibiotics            Stopping of antibiotics       strongly encouraged.               strongly encouraged.    ----------------------------     ------------------------------       PCT level decrease by               PCT < 0.25 ng/mL       >= 80% from peak PCT       OR PCT 0.25 - 0.5 ng/mL          Stopping of antibiotics                                             encouraged.     Stopping of antibiotics           encouraged.    ----------------------------     ------------------------------       PCT level decrease by              PCT >= 0.25 ng/mL       < 80% from peak PCT        AND PCT >= 0.5 ng/mL            Continuin g antibiotics                                              encouraged.  Continuing antibiotics            encouraged.    ----------------------------     ------------------------------     PCT level increase compared          PCT > 0.5 ng/mL         with peak PCT AND          PCT >= 0.5 ng/mL             Escalation of antibiotics                                          strongly encouraged.      Escalation of antibiotics        strongly encouraged.   CBC     Status: Abnormal   Collection Time: 12/31/15  6:45 PM  Result Value Ref Range   WBC 10.9 (H) 4.0 - 10.5 K/uL   RBC 4.12 3.87 - 5.11 MIL/uL   Hemoglobin 11.7 (L) 12.0 - 15.0 g/dL   HCT 37.8 36.0 - 46.0 %   MCV 91.7 78.0 - 100.0 fL   MCH 28.4 26.0 - 34.0 pg   MCHC 31.0 30.0 - 36.0 g/dL   RDW 19.4 (H) 11.5 - 15.5 %   Platelets 152 150 - 400 K/uL  Comprehensive metabolic panel     Status: Abnormal   Collection Time: 12/31/15  6:45 PM  Result Value Ref Range   Sodium 135 135 - 145 mmol/L   Potassium 5.8 (H) 3.5 - 5.1 mmol/L   Chloride 106 101 - 111 mmol/L   CO2 18 (L) 22 - 32  mmol/L   Glucose, Bld 153 (H) 65 - 99 mg/dL   BUN 80 (H) 6 - 20 mg/dL   Creatinine, Ser 1.84 (H) 0.44 - 1.00 mg/dL   Calcium 8.4 (L) 8.9 - 10.3 mg/dL   Total Protein 5.8 (L) 6.5 - 8.1 g/dL   Albumin 3.1 (L) 3.5 - 5.0 g/dL   AST 47 (H) 15 - 41 U/L   ALT 142 (H) 14 - 54 U/L   Alkaline Phosphatase 71 38 - 126 U/L   Total Bilirubin 1.0 0.3 - 1.2 mg/dL   GFR calc non Af Amer 23 (L) >60 mL/min   GFR calc Af Amer 27 (L) >60 mL/min    Comment: (NOTE) The eGFR has been calculated using the CKD EPI equation. This calculation has not been validated in all clinical situations. eGFR's persistently <60 mL/min signify possible Chronic Kidney Disease.    Anion gap 11 5 - 15  Lactic acid, plasma     Status: None   Collection Time: 12/31/15  6:45 PM  Result Value Ref Range   Lactic Acid, Venous 0.9 0.5 - 2.0 mmol/L     Lipid Panel  No results found for: CHOL, TRIG, HDL, CHOLHDL, VLDL, LDLCALC, LDLDIRECT   No results found for: HGBA1C   Lab Results  Component Value Date   CREATININE 1.84* 12/31/2015     HPI : 82 y.o. female with a PMH as outlined below. She moved to [place] just some 2 months ago from [place], [place] and she now resides in an independent living facility [facility]). Per pt's daughters, she was still functional to some extend and although she has had some decline in her health, this has not been significant. She does have some degree of dementia and per chart  review, this has been a progressive issue for several years and there has been concern for recurrent aspiration for several months. On Friday, 12/25/15, she had significant cough and hoarseness. This did not improve on Sunday 12/27/15; therefore, she was taken to urgent care where she was diagnosed with working diagnosis of bronchitis for which she was given Z-pack. The following day, she had worsening of symptoms along with increase in sputum production and increase in anxiety level. Her daughter felt that she  needed to be evaluated by PCP (Dr. Felipa Eth) where they were unable to obtain adequate pulse ox reading. She was subsequently send to ED for further evaluation.  In ED, she was hypotensive (though family reported she is normally hypotensive at baseline). She did require BiPAP for hypoxia and work of breathing and was admitted for with concern for HCAP. Discussions were had with family regarding code status and orders were placed for DNR / DNI.  On 01/12, she got progressively more hypotensive and hypoxic and later became more somnolent with AMS.  PCCM consulted for vasopressor initiation , as patient had received enough fluid boluses to develop congestive heart failure/pulmonary edema. Patient was also evaluated by palliative care during this hospitalization  Despite multiple discussions with the daughters  about the patient's poor  clinical  progress and increase mortality in the setting of right lower lobe pneumonia, sepsis, end-stage congestive heart failure, daughters continue to be optimistic. To ensure that all options were explored,  critical care was consulted to see if they had anything to offer.   Vasopressors would be of no benefit under these circumstances given her co-morbidities. In fact, vasopressors pose a realistic risk of lethal arrhythmias given her significant heart disease. Carvedilol, furosemide, lisinopril (outpatient meds) restricted due to hypotension. Absolutely agree no CPR / ACLS under any circumstances as would cause more harm including certain rib fractures. Full comfort orders entered after extensive family discussions .   Discharge Exam:    Blood pressure 51/42, pulse 99, temperature 97.4 F (36.3 C), temperature source Axillary, resp. rate 8, height '5\' 4"'  (1.626 m), weight 55 kg (121 lb 4.1 oz), SpO2 86 %.        SignedReyne Dumas January 10, 2016, 11:19 AM        Time spent >45 mins

## 2016-01-20 NOTE — Care Management Note (Addendum)
Case Management Note  Patient Details  Name: Marilyn Henderson MRN: [id number] Date of Birth: 1923-07-06  Subjective/Objective:      Pt admitted for HCAP- Pt is from [place]. Palliative Meeting for Goals of Care.               Action/Plan: CM did speak with Palliative Care MD and stated family is considering SNF placement. Pt will need PT/OT to consult. CM did make CSW aware. No further needs at this time.    Expected Discharge Date:                  Expected Discharge Plan:  Skilled Nursing Facility  In-House Referral:  Clinical Social Work  Discharge planning Services  CM Consult  Post Acute Care Choice:    Choice offered to:     DME Arranged:    DME Agency:     HH Arranged:    [place] Agency:     Status of Service:  In process, will continue to follow  Medicare Important Message Given:    Date Medicare IM Given:    Medicare IM give by:    Date Additional Medicare IM Given:    Additional Medicare Important Message give by:     If discussed at [place] of Stay Meetings, dates discussed:    Additional Comments: 0915 Late Entry: Jacqlyn Krauss, RN,BSN [phone] Pt expired @ [phone] January 10, 2016.   Bethena Roys, RN 12/31/2015, 3:53 PM

## 2016-01-20 DEATH — deceased

## 2016-10-20 IMAGING — CR DG HIP (WITH OR WITHOUT PELVIS) 2-3V*L*
4 series · 4 of 4 positions shown · non-contrast
Comparison: None

CLINICAL DATA: Left hip pain for 2 days

EXAM:
DG HIP (WITH OR WITHOUT PELVIS) 2-3V LEFT

[ap (1 of 3)]
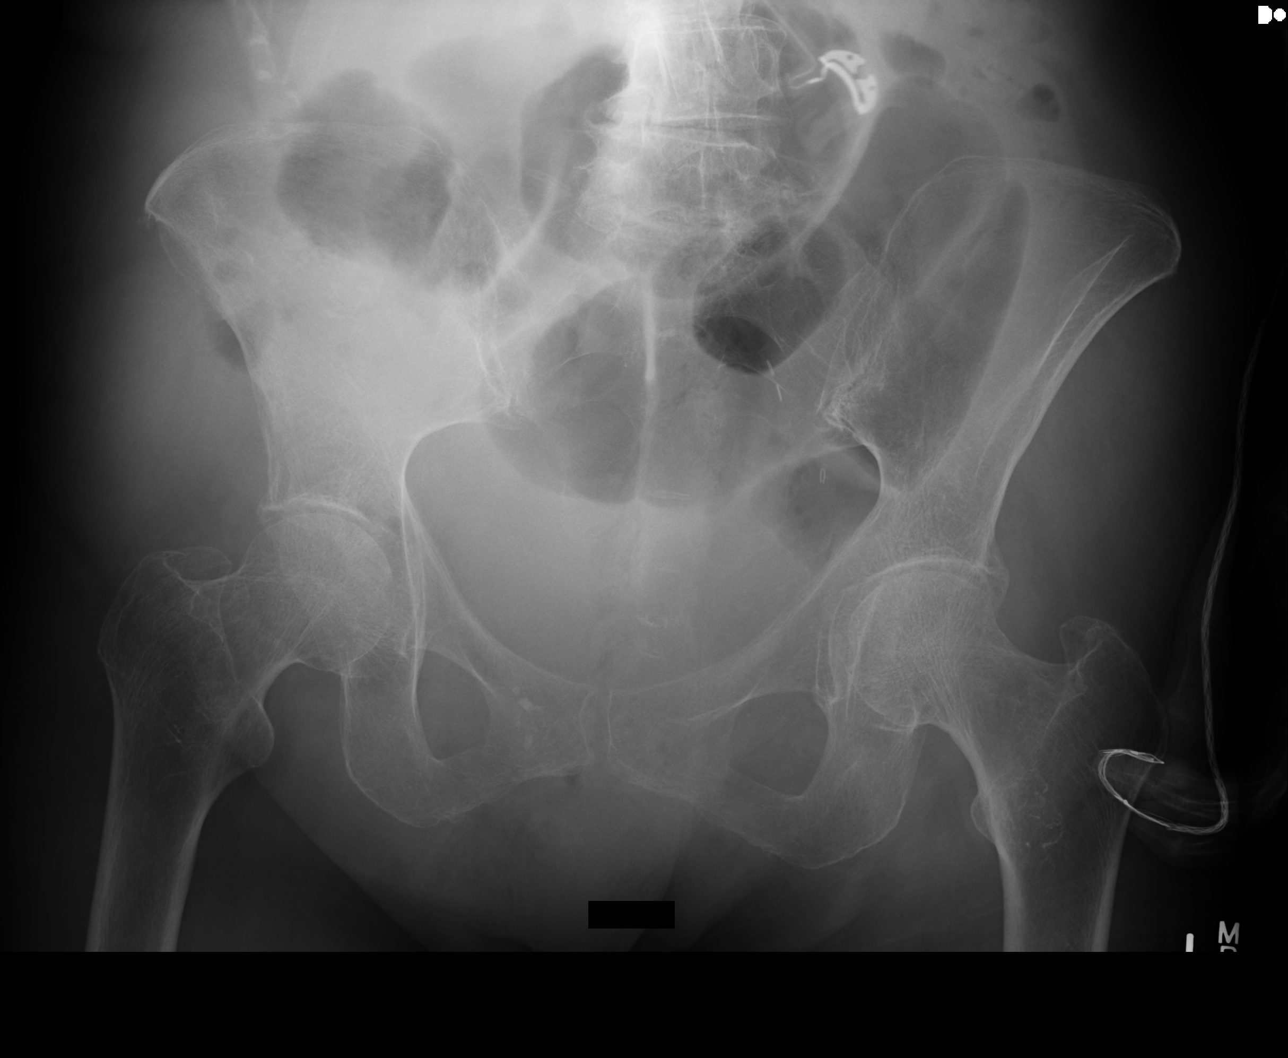

[ap (2 of 3)]
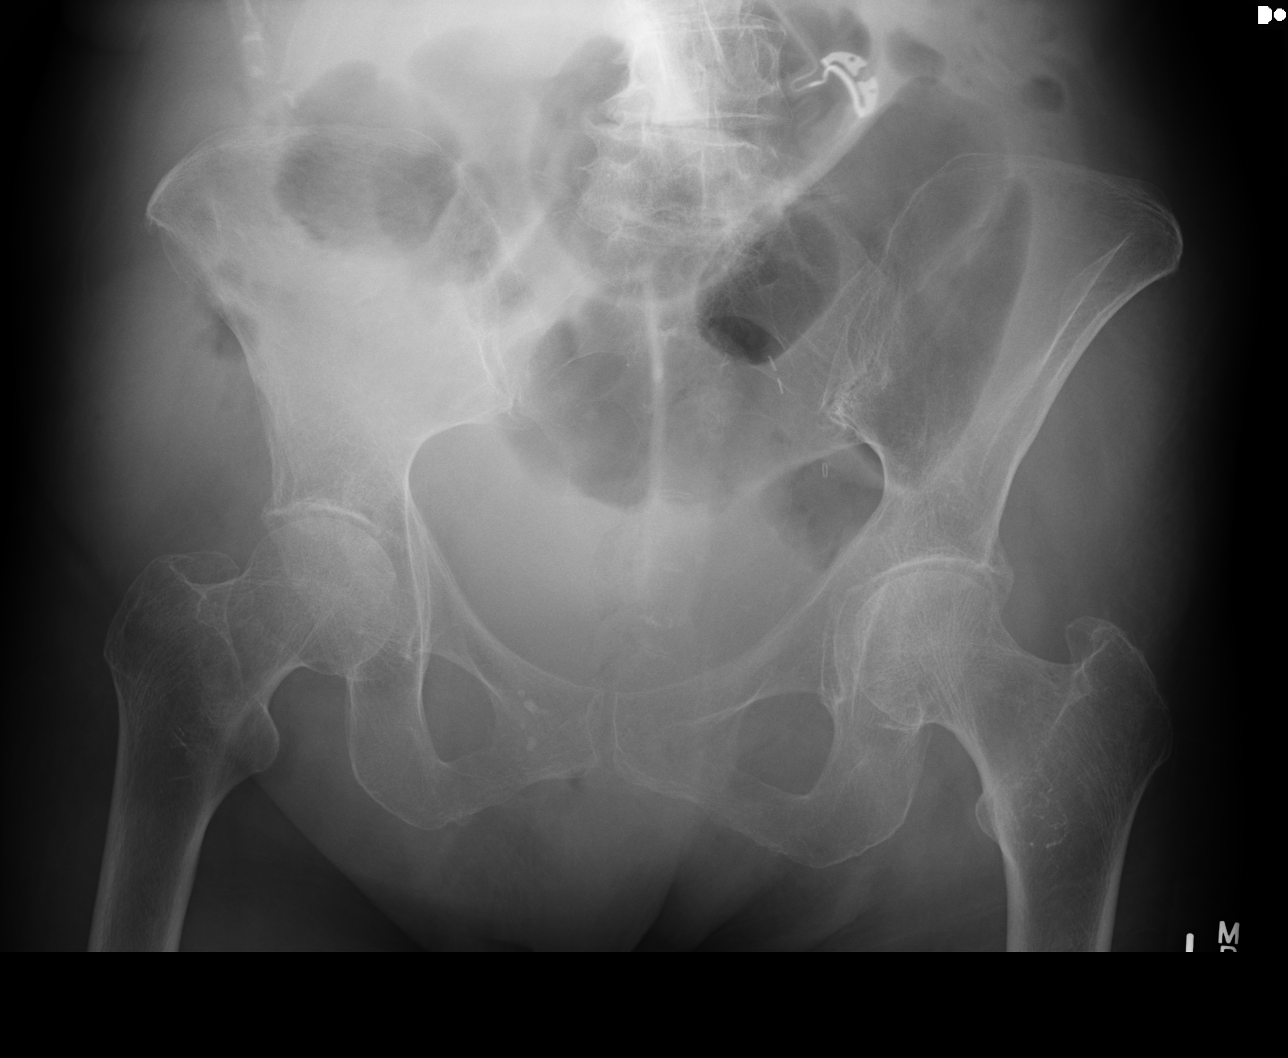

[ap (3 of 3)]
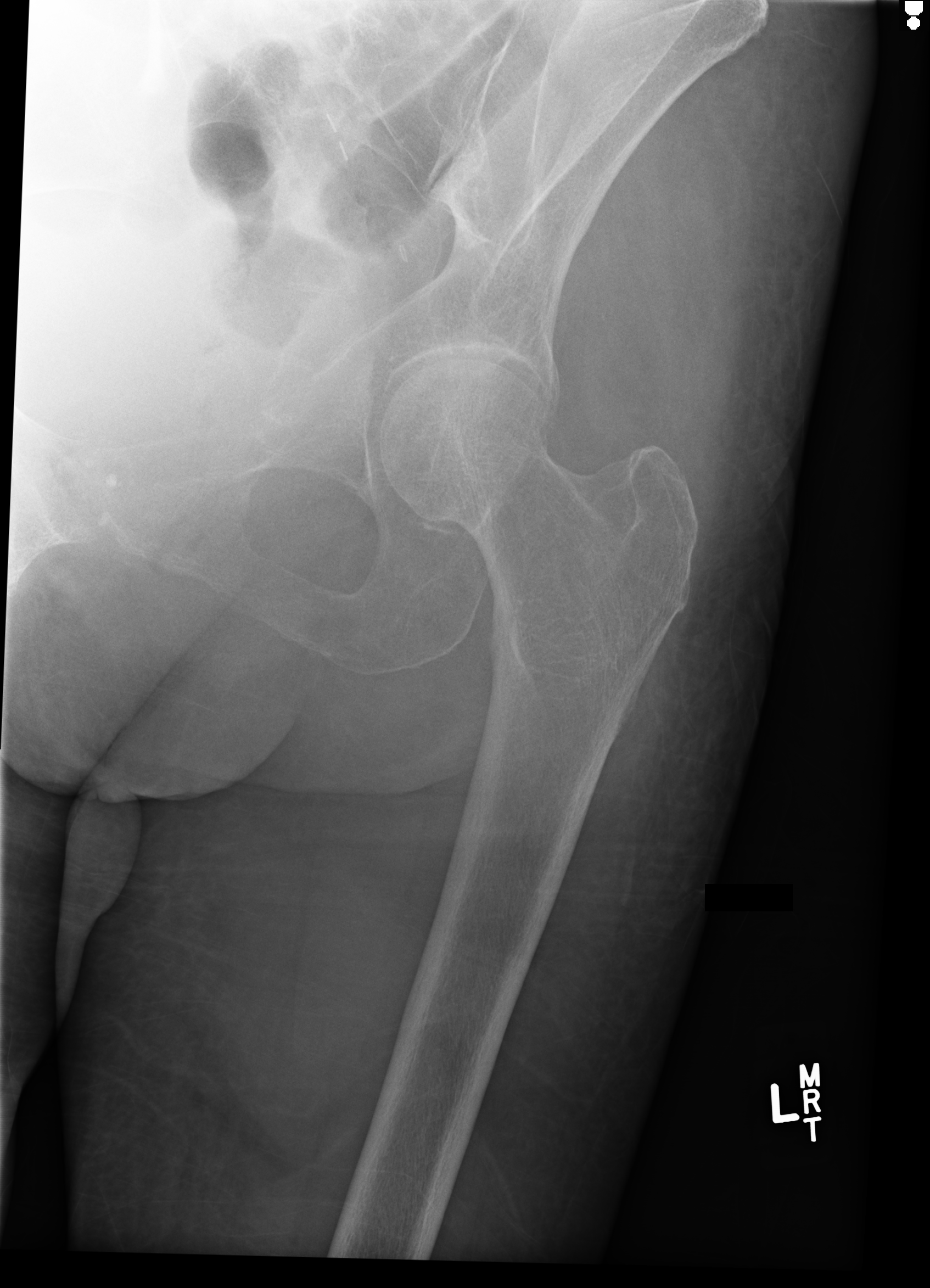

[axial]
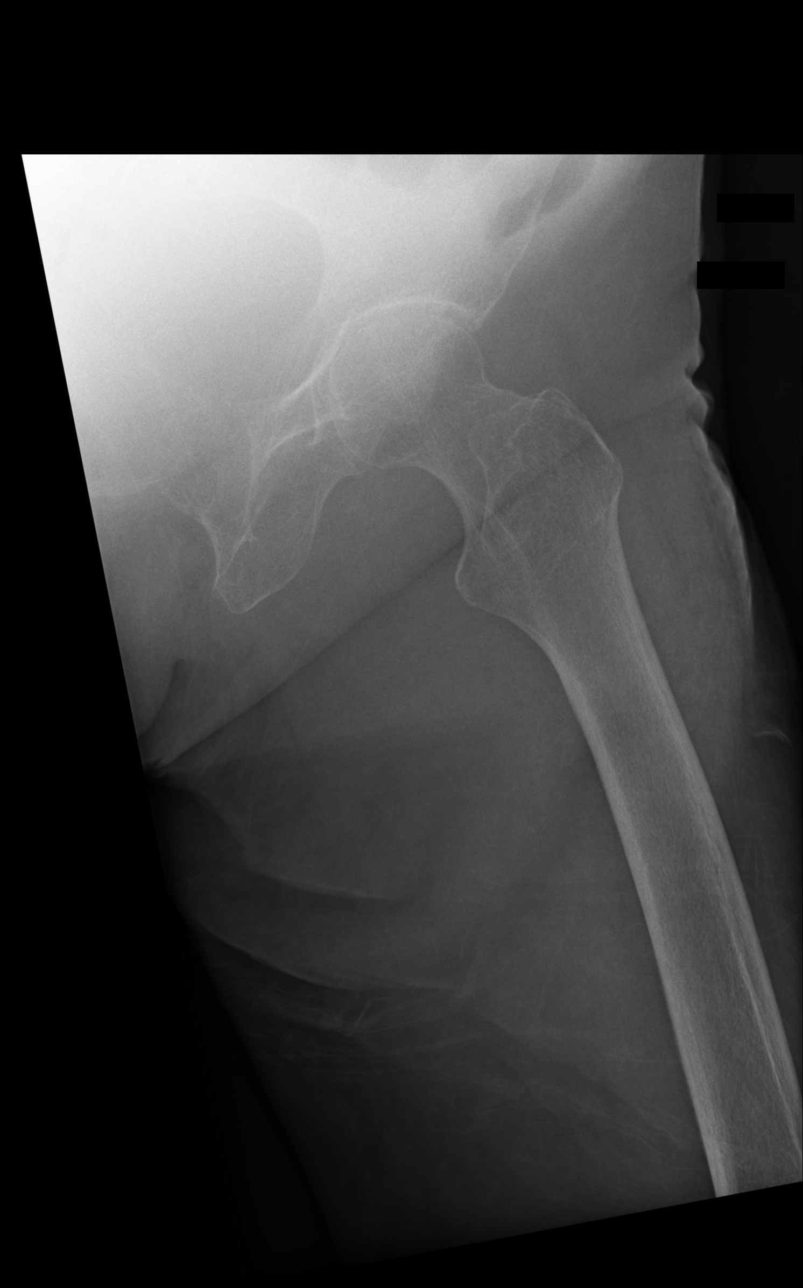

[4 of 4 positions shown; findings below may reference images not displayed]

FINDINGS: There is no evidence of hip fracture or dislocation. There is no
evidence of arthropathy or other focal bone abnormality. Scoliosis
noted within the lumbar spine.
IMPRESSION: 1. No acute findings.
2. If there is high clinical suspicion for occult fracture or the
patient refuses to weightbear, consider further evaluation with MRI.
Although CT is expeditious, evidence is lacking regarding accuracy
of CT over plain film radiography.
